# Patient Record
Sex: Male | Born: 1950 | Race: White | Hispanic: No | Marital: Married | Smoking: Former smoker
Health system: Southern US, Community
[De-identification: ages and names within clinical notes are randomized; demographics above are authoritative.]

## PROBLEM LIST (undated history)

## (undated) DIAGNOSIS — E559 Vitamin D deficiency, unspecified: Secondary | ICD-10-CM

## (undated) DIAGNOSIS — E7800 Pure hypercholesterolemia, unspecified: Secondary | ICD-10-CM

## (undated) DIAGNOSIS — I10 Essential (primary) hypertension: Secondary | ICD-10-CM

## (undated) DIAGNOSIS — E785 Hyperlipidemia, unspecified: Secondary | ICD-10-CM

---
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
## 2014-03-08 ENCOUNTER — Other Ambulatory Visit (HOSPITAL_COMMUNITY): Payer: Self-pay | Admitting: Family Medicine

## 2014-03-08 DIAGNOSIS — I10 Essential (primary) hypertension: Secondary | ICD-10-CM

## 2014-03-08 DIAGNOSIS — E782 Mixed hyperlipidemia: Secondary | ICD-10-CM

## 2014-03-08 DIAGNOSIS — R0989 Other specified symptoms and signs involving the circulatory and respiratory systems: Secondary | ICD-10-CM

## 2014-03-10 ENCOUNTER — Other Ambulatory Visit (HOSPITAL_COMMUNITY): Payer: Self-pay | Admitting: Internal Medicine

## 2014-03-10 ENCOUNTER — Ambulatory Visit (HOSPITAL_COMMUNITY)
Admission: RE | Admit: 2014-03-10 | Discharge: 2014-03-10 | Disposition: A | Discharge status: Home / Self Care | Payer: 59 | Source: Ambulatory Visit | Attending: Family Medicine | Admitting: Family Medicine

## 2014-03-10 DIAGNOSIS — I10 Essential (primary) hypertension: Secondary | ICD-10-CM | POA: Diagnosis not present

## 2014-03-10 DIAGNOSIS — R0989 Other specified symptoms and signs involving the circulatory and respiratory systems: Secondary | ICD-10-CM | POA: Diagnosis not present

## 2014-03-10 DIAGNOSIS — E782 Mixed hyperlipidemia: Secondary | ICD-10-CM | POA: Diagnosis not present

## 2015-12-09 ENCOUNTER — Emergency Department (HOSPITAL_COMMUNITY): Payer: Worker's Compensation

## 2015-12-09 ENCOUNTER — Encounter (HOSPITAL_COMMUNITY): Payer: Self-pay | Admitting: Emergency Medicine

## 2015-12-09 ENCOUNTER — Emergency Department (HOSPITAL_COMMUNITY)
Admission: EM | Admit: 2015-12-09 | Discharge: 2015-12-09 | Disposition: A | Discharge status: Home / Self Care | Payer: Worker's Compensation | Attending: Emergency Medicine | Admitting: Emergency Medicine

## 2015-12-09 DIAGNOSIS — Y990 Civilian activity done for income or pay: Secondary | ICD-10-CM | POA: Diagnosis not present

## 2015-12-09 DIAGNOSIS — Y939 Activity, unspecified: Secondary | ICD-10-CM | POA: Diagnosis not present

## 2015-12-09 DIAGNOSIS — I10 Essential (primary) hypertension: Secondary | ICD-10-CM | POA: Insufficient documentation

## 2015-12-09 DIAGNOSIS — Z87891 Personal history of nicotine dependence: Secondary | ICD-10-CM | POA: Insufficient documentation

## 2015-12-09 DIAGNOSIS — Z79899 Other long term (current) drug therapy: Secondary | ICD-10-CM | POA: Diagnosis not present

## 2015-12-09 DIAGNOSIS — Y929 Unspecified place or not applicable: Secondary | ICD-10-CM | POA: Insufficient documentation

## 2015-12-09 DIAGNOSIS — S39012A Strain of muscle, fascia and tendon of lower back, initial encounter: Secondary | ICD-10-CM | POA: Insufficient documentation

## 2015-12-09 DIAGNOSIS — M545 Low back pain: Secondary | ICD-10-CM | POA: Diagnosis present

## 2015-12-09 DIAGNOSIS — X500XXA Overexertion from strenuous movement or load, initial encounter: Secondary | ICD-10-CM | POA: Diagnosis not present

## 2015-12-09 HISTORY — DX: Pure hypercholesterolemia, unspecified: E78.00

## 2015-12-09 HISTORY — DX: Essential (primary) hypertension: I10

## 2015-12-09 MED ORDER — METHOCARBAMOL 500 MG PO TABS: 500.0000 mg | ORAL_TABLET | Freq: Four times a day (QID) | ORAL | Status: AC

## 2015-12-09 MED ORDER — NAPROXEN 500 MG PO TABS: 500.0000 mg | ORAL_TABLET | Freq: Two times a day (BID) | ORAL | Status: DC

## 2015-12-09 MED ORDER — HYDROCODONE-ACETAMINOPHEN 5-325 MG PO TABS: 1.0000 | ORAL_TABLET | ORAL | Status: DC | PRN

## 2015-12-09 MED ORDER — HYDROCODONE-ACETAMINOPHEN 5-325 MG PO TABS
1.0000 | ORAL_TABLET | Freq: Once | ORAL | Status: AC
  Administered 2015-12-09: 1 via ORAL
  Filled 2015-12-09: qty 1

## 2015-12-09 NOTE — Discharge Instructions (Signed)
Back Injury Prevention Back injuries can be very painful. They can also be difficult to heal. After having one back injury, you are more likely to injure your back again. It is important to learn how to avoid injuring or re-injuring your back. The following tips can help you to prevent a back injury. WHAT SHOULD I KNOW ABOUT PHYSICAL FITNESS?  Exercise for 30 minutes per day on most days of the week or as told by your doctor. Make sure to:  Do aerobic exercises, such as walking, jogging, biking, or swimming.  Do exercises that increase balance and strength, such as tai chi and yoga.  Do stretching exercises. This helps with flexibility.  Try to develop strong belly (abdominal) muscles. Your belly muscles help to support your back.  Stay at a healthy weight. This helps to decrease your risk of a back injury. WHAT SHOULD I KNOW ABOUT MY DIET?  Talk with your doctor about your overall diet. Take supplements and vitamins only as told by your doctor.  Talk with your doctor about how much calcium and vitamin D you need each day. These nutrients help to prevent weakening of the bones (osteoporosis).  Include good sources of calcium in your diet, such as:  Dairy products.  Green leafy vegetables.  Products that have had calcium added to them (fortified).  Include good sources of vitamin D in your diet, such as:  Milk.  Foods that have had vitamin D added to them. WHAT SHOULD I KNOW ABOUT MY POSTURE?  Sit up straight and stand up straight. Avoid leaning forward when you sit or hunching over when you stand.  Choose chairs that have good low-back (lumbar) support.  If you work at a desk, sit close to it so you do not need to lean over. Keep your chin tucked in. Keep your neck drawn back. Keep your elbows bent so your arms look like the letter "L" (right angle).  Sit high and close to the steering wheel when you drive. Add a low-back support to your car seat, if needed.  Avoid sitting  or standing in one position for very long. Take breaks to get up, stretch, and walk around at least one time every hour. Take breaks every hour if you are driving for long periods of time.  Sleep on your side with your knees slightly bent, or sleep on your back with a pillow under your knees. Do not lie on the front of your body to sleep. WHAT SHOULD I KNOW ABOUT LIFTING, TWISTING, AND REACHING Lifting and Heavy Lifting  Avoid heavy lifting, especially lifting over and over again. If you must do heavy lifting:  Stretch before lifting.  Work slowly.  Rest between lifts.  Use a tool such as a cart or a dolly to move objects if one is available.  Make several small trips instead of carrying one heavy load.  Ask for help when you need it, especially when moving big objects.  Follow these steps when lifting:  Stand with your feet shoulder-width apart.  Get as close to the object as you can. Do not pick up a heavy object that is far from your body.  Use handles or lifting straps if they are available.  Bend at your knees. Squat down, but keep your heels off the floor.  Keep your shoulders back. Keep your chin tucked in. Keep your back straight.  Lift the object slowly while you tighten the muscles in your legs, belly, and butt. Keep the object  as close to the center of your body as possible.  Follow these steps when putting down a heavy load:  Stand with your feet shoulder-width apart.  Lower the object slowly while you tighten the muscles in your legs, belly, and butt. Keep the object as close to the center of your body as possible.  Keep your shoulders back. Keep your chin tucked in. Keep your back straight.  Bend at your knees. Squat down, but keep your heels off the floor.  Use handles or lifting straps if they are available. Twisting and Reaching  Avoid lifting heavy objects above your waist.  Do not twist at your waist while you are lifting or carrying a load. If  you need to turn, move your feet.  Do not bend over without bending at your knees.  Avoid reaching over your head, across a table, or for an object on a high surface.  WHAT ARE SOME OTHER TIPS?  Avoid wet floors and icy ground. Keep sidewalks clear of ice to prevent falls.   Do not sleep on a mattress that is too soft or too hard.   Keep items that you use often within easy reach.   Put heavier objects on shelves at waist level, and put lighter objects on lower or higher shelves.  Find ways to lower your stress, such as:  Exercise.  Massage.  Relaxation techniques.  Talk with your doctor if you feel anxious or depressed. These conditions can make back pain worse.  Wear flat heel shoes with cushioned soles.  Avoid making quick (sudden) movements.  Use both shoulder straps when carrying a backpack.  Do not use any tobacco products, including cigarettes, chewing tobacco, or electronic cigarettes. If you need help quitting, ask your doctor.   This information is not intended to replace advice given to you by your health care provider. Make sure you discuss any questions you have with your health care provider.   Document Released: 10/29/2007 Document Revised: 09/26/2014 Document Reviewed: 05/16/2014 Elsevier Interactive Patient Education 2016 [employer]  Lumbosacral Strain Lumbosacral strain is a strain of any of the parts that make up your lumbosacral vertebrae. Your lumbosacral vertebrae are the bones that make up the lower third of your backbone. Your lumbosacral vertebrae are held together by muscles and tough, fibrous tissue (ligaments).  CAUSES  A sudden blow to your back can cause lumbosacral strain. Also, anything that causes an excessive stretch of the muscles in the low back can cause this strain. This is typically seen when people exert themselves strenuously, fall, lift heavy objects, bend, or crouch repeatedly. RISK FACTORS  Physically demanding  work.  Participation in pushing or pulling sports or sports that require a sudden twist of the back (tennis, golf, baseball).  Weight lifting.  Excessive lower back curvature.  Forward-tilted pelvis.  Weak back or abdominal muscles or both.  Tight hamstrings. SIGNS AND SYMPTOMS  Lumbosacral strain may cause pain in the area of your injury or pain that moves (radiates) down your leg.  DIAGNOSIS Your health care provider can often diagnose lumbosacral strain through a physical exam. In some cases, you may need tests such as X-ray exams.  TREATMENT  Treatment for your lower back injury depends on many factors that your clinician will have to evaluate. However, most treatment will include the use of anti-inflammatory medicines. HOME CARE INSTRUCTIONS   Avoid hard physical activities (tennis, racquetball, waterskiing) if you are not in proper physical condition for it. This may aggravate or create  problems.  If you have a back problem, avoid sports requiring sudden body movements. Swimming and walking are generally safer activities.  Maintain good posture.  Maintain a healthy weight.  For acute conditions, you may put ice on the injured area.  Put ice in a plastic bag.  Place a towel between your skin and the bag.  Leave the ice on for 20 minutes, 2-3 times a day.  When the low back starts healing, stretching and strengthening exercises may be recommended. SEEK MEDICAL CARE IF:  Your back pain is getting worse.  You experience severe back pain not relieved with medicines. SEEK IMMEDIATE MEDICAL CARE IF:   You have numbness, tingling, weakness, or problems with the use of your arms or legs.  There is a change in bowel or bladder control.  You have increasing pain in any area of the body, including your belly (abdomen).  You notice shortness of breath, dizziness, or feel faint.  You feel sick to your stomach (nauseous), are throwing up (vomiting), or become  sweaty.  You notice discoloration of your toes or legs, or your feet get very cold. MAKE SURE YOU:   Understand these instructions.  Will watch your condition.  Will get help right away if you are not doing well or get worse.   This information is not intended to replace advice given to you by your health care provider. Make sure you discuss any questions you have with your health care provider.   Document Released: 02/19/2005 Document Revised: 06/02/2014 Document Reviewed: 12/29/2012 Elsevier Interactive Patient Education 2016 [place] not drive within 4 hours of taking hydrocodone or robaxin as these will make you drowsy.  Avoid lifting,  Bending,  Twisting or any other activity that worsens your pain over the next week.  Apply an  icepack  to your lower back for 10-15 minutes every 2 hours for the next 2 days.  You should get rechecked if your symptoms are not better over the next 5 days,  Or you develop increased pain,  Weakness in your leg(s) or loss of bladder or bowel function - these are symptoms of a worse injury.

## 2015-12-09 NOTE — ED Provider Notes (Signed)
CSN: [id number]     Arrival date & time 12/09/15  1209 History  By signing my name below, I, Doreatha Martin, attest that this documentation has been prepared under the direction and in the presence of Burgess Amor, PA-C. Electronically Signed: Doreatha Martin, ED Scribe. 12/09/2015. 1:00 PM.    Chief Complaint  Patient presents with  . Back Pain   The history is provided by the patient. No language interpreter was used.   HPI Comments: Patrick Nielsen is a 65 y.o. male who presents to the Emergency Department complaining of moderate, non-radiating, throbbing left lower back pain onset 2 weeks ago s/p lifting a 100 lb object. He denies additional injuries, falls or trauma. Per pt, he felt a small amount of pain initially that has continued to worsen since initial onset. He reports that he frequently lifts this amount of weight. He reports that he has tried ice, topical cream and heat with some relief of pain. Pt states that his pain is alleviated when lying flat and worsened with moment, ambulation and in certain positions. He reports h/o similar back pain. Pt is ambulatory with minimal difficulty. No h/o cancer, IVDU, back surgery. Pt is a former smoker and an occasional drinker. He denies bowel or bladder incontinence, saddle anesthesia, fever, cough, abdominal pain, nausea, emesis, dysuria, hematuria, frequency, rash. He also denies numbness, focal weakness or paresthesia of the lower extremities. NKDA.   Past Medical History  Diagnosis Date  . Hypertension   . High cholesterol    History reviewed. No pertinent past surgical history. History reviewed. No pertinent family history. Social History  Substance Use Topics  . Smoking status: Former Smoker -- 2.00 packs/day for 28 years    Types: Cigarettes    Quit date: 05/26/1996  . Smokeless tobacco: Never Used  . Alcohol Use: 7.2 oz/week    12 Cans of beer per week    Review of Systems  Constitutional: Negative for fever.  Respiratory: Negative for  cough.   Gastrointestinal: Negative for nausea, vomiting and abdominal pain.       Negative for bowel incontinence.   Genitourinary: Negative for dysuria, frequency and hematuria.       Negative for bladder incontinence and saddle anesthesia.   Musculoskeletal: Positive for back pain.  Skin: Negative for rash.  Neurological: Negative for weakness and numbness.       Negative for paresthesias.    Allergies  Review of patient's allergies indicates no known allergies.  Home Medications   Prior to Admission medications   Medication Sig Start Date End Date Taking? Authorizing Provider  amLODipine (NORVASC) 10 MG tablet Take 10 mg by mouth daily. 11/14/15  Yes Historical Provider, MD  losartan-hydrochlorothiazide (HYZAAR) 100-25 MG tablet Take 1 tablet by mouth daily. 11/19/15  Yes Historical Provider, MD  Omega-3 Fatty Acids (FISH OIL PO) Take 1 capsule by mouth daily.   Yes Historical Provider, MD  simvastatin (ZOCOR) 20 MG tablet Take 20 mg by mouth daily. 11/19/15  Yes Historical Provider, MD  HYDROcodone-acetaminophen (NORCO/VICODIN) 5-325 MG tablet Take 1 tablet by mouth every 4 (four) hours as needed. 12/09/15   Burgess Amor, PA-C  methocarbamol (ROBAXIN) 500 MG tablet Take 1-2 tablets (500-1,000 mg total) by mouth 4 (four) times daily. 12/09/15 12/19/15  Burgess Amor, PA-C  naproxen (NAPROSYN) 500 MG tablet Take 1 tablet (500 mg total) by mouth 2 (two) times daily. 12/09/15   Burgess Amor, PA-C   BP 133/56 mmHg  Pulse 76  Temp(Src) 98 F (36.7  C) (Oral)  Resp 18  Ht 5\' 6"  (1.676 m)  Wt 93.895 kg  BMI 33.43 kg/m2  SpO2 98% Physical Exam  Constitutional: He appears well-developed and well-nourished.  HENT:  Head: Normocephalic.  Eyes: Conjunctivae are normal.  Neck: Normal range of motion. Neck supple.  Cardiovascular: Normal rate and intact distal pulses.   Pedal pulses normal.  Pulmonary/Chest: Effort normal.  Abdominal: Soft. Bowel sounds are normal. He exhibits no distension and no  mass.  Musculoskeletal: Normal range of motion. He exhibits no edema.       Lumbar back: He exhibits tenderness. He exhibits no swelling, no edema and no spasm.  ttp left lumbar soft tissue.  Neurological: He is alert. He has normal strength. He displays no atrophy and no tremor. No sensory deficit. Gait normal.  Reflex Scores:      Patellar reflexes are 2+ on the right side and 2+ on the left side.      Achilles reflexes are 2+ on the right side and 2+ on the left side. No strength deficit noted in hip and knee flexor and extensor muscle groups.  Ankle flexion and extension intact.  Skin: Skin is warm and dry.  Psychiatric: He has a normal mood and affect.  Nursing note and vitals reviewed.   ED Course  Procedures (including critical care time) DIAGNOSTIC STUDIES: Oxygen Saturation is 100% on RA, normal by my interpretation.    COORDINATION OF CARE: 12:57 PM Discussed treatment plan with pt at bedside which includes XR and pt agreed to plan.   Imaging Review Dg Lumbar Spine Complete  12/09/2015  CLINICAL DATA:  Low back pain EXAM: LUMBAR SPINE - COMPLETE 4+ VIEW COMPARISON:  None. FINDINGS: There is no evidence of lumbar spine fracture. Alignment is normal. Mild degenerative disc disease noted within the lower lumbar spine. Aortic atherosclerosis is noted. IMPRESSION: 1. Lumbar degenerative disc disease. 2. Aortic atherosclerosis. Electronically Signed   By: Signa Kellaylor  Stroud M.D.   On: 12/09/2015 13:54   I have personally reviewed and evaluated these images as part of my medical decision-making.   MDM   Final diagnoses:  Lumbar strain, initial encounter    No neuro deficit on exam or by history to suggest emergent or surgical presentation.   discussed worsened sx that should prompt immediate re-evaluation including distal weakness, bowel/bladder retention/incontinence.   Patient was placed on hydrocodone, Robaxin and naproxen.  Advised heat therapy.  Activity as tolerated.  Plan  follow-up with his PCP for recheck is symptoms are not improving over the next week.       Burgess AmorJulie Wang [place], PA-C 12/09/15 1651  Samuel JesterKathleen McManus, DO 12/10/15 2229

## 2015-12-09 NOTE — ED Notes (Signed)
Patient c/o left lower back pain that radiates into mid-back. 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 Denies taking anything for pain but reports using ice and heat with no relief. CNS intact. Denies any problems with urination or BMs.

## 2016-02-17 ENCOUNTER — Emergency Department (HOSPITAL_COMMUNITY)
Admission: EM | Admit: 2016-02-17 | Discharge: 2016-02-17 | Disposition: A | Discharge status: Home / Self Care | Payer: 59 | Attending: Emergency Medicine | Admitting: Emergency Medicine

## 2016-02-17 ENCOUNTER — Emergency Department (HOSPITAL_COMMUNITY): Payer: 59

## 2016-02-17 ENCOUNTER — Encounter (HOSPITAL_COMMUNITY): Payer: Self-pay | Admitting: Emergency Medicine

## 2016-02-17 DIAGNOSIS — W11XXXA Fall on and from ladder, initial encounter: Secondary | ICD-10-CM | POA: Insufficient documentation

## 2016-02-17 DIAGNOSIS — I10 Essential (primary) hypertension: Secondary | ICD-10-CM | POA: Insufficient documentation

## 2016-02-17 DIAGNOSIS — Z79899 Other long term (current) drug therapy: Secondary | ICD-10-CM | POA: Insufficient documentation

## 2016-02-17 DIAGNOSIS — S93402A Sprain of unspecified ligament of left ankle, initial encounter: Secondary | ICD-10-CM | POA: Insufficient documentation

## 2016-02-17 DIAGNOSIS — Y999 Unspecified external cause status: Secondary | ICD-10-CM | POA: Diagnosis not present

## 2016-02-17 DIAGNOSIS — Z87891 Personal history of nicotine dependence: Secondary | ICD-10-CM | POA: Insufficient documentation

## 2016-02-17 DIAGNOSIS — S99912A Unspecified injury of left ankle, initial encounter: Secondary | ICD-10-CM | POA: Diagnosis present

## 2016-02-17 DIAGNOSIS — W19XXXA Unspecified fall, initial encounter: Secondary | ICD-10-CM

## 2016-02-17 DIAGNOSIS — Y929 Unspecified place or not applicable: Secondary | ICD-10-CM | POA: Insufficient documentation

## 2016-02-17 DIAGNOSIS — Y9339 Activity, other involving climbing, rappelling and jumping off: Secondary | ICD-10-CM | POA: Diagnosis not present

## 2016-02-17 MED ORDER — IBUPROFEN 800 MG PO TABS
800.0000 mg | ORAL_TABLET | Freq: Once | ORAL | Status: AC
  Administered 2016-02-17: 800 mg via ORAL
  Filled 2016-02-17: qty 1

## 2016-02-17 MED ORDER — ACETAMINOPHEN 500 MG PO TABS
1000.0000 mg | ORAL_TABLET | Freq: Once | ORAL | Status: AC
  Administered 2016-02-17: 1000 mg via ORAL
  Filled 2016-02-17: qty 2

## 2016-02-17 NOTE — Discharge Instructions (Signed)
X-ray shows no fracture. Elevate ankle, ice, ankle support, Tylenol or ibuprofen for pain. This will be sore for several days.

## 2016-02-17 NOTE — ED Triage Notes (Addendum)
Pt reports was reaching for post hole diggers while standing on a step ladder on Friday. Pt reports lost balance and reports left ankle pain ever since. Pt reports fell approximately 533ft. Pt denies loc. nad noted. EDP at bedside. Pt reports took percocet prior to arrival. Pt reports prescription from past back injury.

## 2016-02-17 NOTE — ED Provider Notes (Signed)
[facility]-EMERGENCY DEPT Provider Note   CSN: [id number] Arrival date & time: 02/17/16  0756   By signing my name below, I, Nelwyn Salisbury, attest that this documentation has been prepared under the direction and in the presence of Donnetta Hutching, MD . Electronically Signed: Nelwyn Salisbury, Scribe. 02/17/2016. 8:03 AM.   History   Chief Complaint Chief Complaint  Patient presents with  . Fall   The history is provided by the patient. No language interpreter was used.   HPI Comments:  Dezmin Kittelson is a 65 y.o. male with PMHx of HTN and HLD who presents to the Emergency Department complaining of sudden-onset constant left ankle pain beginning 2 days ago. Pt reports he was climbing a ladder, lost his footing, and fell about 4 feet. His pain is worsened with movement and palpation, with no alleviating factors indicated. He endorses associated swelling to the area.   Past Medical History:  Diagnosis Date  . High cholesterol   . Hypertension     There are no active problems to display for this patient.   History reviewed. No pertinent surgical history.   Home Medications    Prior to Admission medications   Medication Sig Start Date End Date Taking? Authorizing Provider  amLODipine (NORVASC) 10 MG tablet Take 10 mg by mouth daily. 11/14/15   Historical Provider, MD  HYDROcodone-acetaminophen (NORCO/VICODIN) 5-325 MG tablet Take 1 tablet by mouth every 4 (four) hours as needed. 12/09/15   Burgess Amor, PA-C  losartan-hydrochlorothiazide (HYZAAR) 100-25 MG tablet Take 1 tablet by mouth daily. 11/19/15   Historical Provider, MD  naproxen (NAPROSYN) 500 MG tablet Take 1 tablet (500 mg total) by mouth 2 (two) times daily. 12/09/15   Burgess Amor, PA-C  Omega-3 Fatty Acids (FISH OIL PO) Take 1 capsule by mouth daily.    Historical Provider, MD  simvastatin (ZOCOR) 20 MG tablet Take 20 mg by mouth daily. 11/19/15   Historical Provider, MD    Family History History reviewed. No pertinent family  history.  Social History Social History  Substance Use Topics  . Smoking status: Former Smoker    Packs/day: 2.00    Years: 28.00    Types: Cigarettes    Quit date: 05/26/1996  . Smokeless tobacco: Never Used  . Alcohol use 7.2 oz/week    12 Cans of beer per week     Comment: only on weekends     Allergies   Review of patient's allergies indicates no known allergies.   Review of Systems Review of Systems  Musculoskeletal: Positive for arthralgias and joint swelling.  All other systems reviewed and are negative.    Physical Exam Updated Vital Signs BP 146/56   Pulse 78   Temp 98.4 F (36.9 C) (Oral)   Resp (!) 81   Ht 5\' 6"  (1.676 m)   Wt 200 lb (90.7 kg)   SpO2 99%   BMI 32.28 kg/m   Physical Exam  Constitutional: He is oriented to person, place, and time. He appears well-developed and well-nourished.  HENT:  Head: Normocephalic and atraumatic.  Eyes: Conjunctivae are normal.  Neck: Neck supple.  Cardiovascular: Normal rate and regular rhythm.   Pulmonary/Chest: Effort normal and breath sounds normal.  Abdominal: Soft. Bowel sounds are normal.  Musculoskeletal:  Generally tender circumferentially to left ankle. Tib/Fib fine, foot okay.   Neurological: He is alert and oriented to person, place, and time.  Skin: Skin is warm and dry.  Psychiatric: He has a normal mood and affect. His behavior  is normal.  Nursing note and vitals reviewed.    ED Treatments / Results  DIAGNOSTIC STUDIES:  Oxygen Saturation is 100% on RA, normal by my interpretation.    Labs (all labs ordered are listed, but only abnormal results are displayed) Labs Reviewed - No data to display  EKG  EKG Interpretation None       Radiology Dg Ankle Complete Left  Result Date: 02/17/2016 CLINICAL DATA:  Left ankle pain since following a approximately 3 feet off a ladder 2 days ago. EXAM: LEFT ANKLE COMPLETE - 3+ VIEW COMPARISON:  None. FINDINGS: Diffuse soft tissue swelling, most  pronounced laterally. Possible small effusion. No fracture or dislocation seen. Moderate-sized inferior calcaneal spur. IMPRESSION: No fracture.  Possible small effusion. Electronically Signed   By: Beckie SaltsSteven  Reid M.D.   On: 02/17/2016 08:35    Procedures Procedures (including critical care time)  Medications Ordered in ED Medications - No data to display   Initial Impression / Assessment and Plan / ED Course  I have reviewed the triage vital signs and the nursing notes.  Pertinent labs & imaging results that were available during my care of the patient were reviewed by me and considered in my medical decision making (see chart for details).  Clinical Course   COORDINATION OF CARE:  8:05 AM Discussed treatment plan with pt at bedside which included an X-ray of his left ankle and pt agreed to plan.  Plain films of left ankle negative for fracture. No head or neck trauma. RICE.  Final Clinical Impressions(s) / ED Diagnoses   Final diagnoses:  Fall, initial encounter  Left ankle sprain, initial encounter    New Prescriptions New Prescriptions   No medications on file   Medical screening examination/treatment/procedure(s) were conducted as a shared visit with non-physician practitioner(s) and myself.  I personally evaluated the patient during the encounter.   EKG Interpretation None        Donnetta HutchingBrian Jaislyn Blinn, MD 02/17/16 1017

## 2016-03-06 ENCOUNTER — Other Ambulatory Visit (HOSPITAL_COMMUNITY): Payer: Self-pay | Admitting: Family Medicine

## 2016-03-06 DIAGNOSIS — I6523 Occlusion and stenosis of bilateral carotid arteries: Secondary | ICD-10-CM

## 2016-03-06 DIAGNOSIS — E782 Mixed hyperlipidemia: Secondary | ICD-10-CM

## 2016-11-04 ENCOUNTER — Other Ambulatory Visit (HOSPITAL_COMMUNITY): Payer: Self-pay | Admitting: Internal Medicine

## 2016-11-04 DIAGNOSIS — R0989 Other specified symptoms and signs involving the circulatory and respiratory systems: Secondary | ICD-10-CM

## 2016-11-05 ENCOUNTER — Other Ambulatory Visit (HOSPITAL_COMMUNITY): Payer: Self-pay | Admitting: Internal Medicine

## 2016-11-05 DIAGNOSIS — R011 Cardiac murmur, unspecified: Secondary | ICD-10-CM

## 2016-11-07 ENCOUNTER — Other Ambulatory Visit (HOSPITAL_COMMUNITY): Payer: Self-pay

## 2016-11-07 ENCOUNTER — Ambulatory Visit (HOSPITAL_COMMUNITY): Payer: 59

## 2016-11-11 ENCOUNTER — Ambulatory Visit (HOSPITAL_COMMUNITY)
Admission: RE | Admit: 2016-11-11 | Discharge: 2016-11-11 | Disposition: A | Discharge status: Home / Self Care | Payer: 59 | Source: Ambulatory Visit | Attending: Internal Medicine | Admitting: Internal Medicine

## 2016-11-11 DIAGNOSIS — R0989 Other specified symptoms and signs involving the circulatory and respiratory systems: Secondary | ICD-10-CM | POA: Diagnosis present

## 2016-11-11 DIAGNOSIS — I371 Nonrheumatic pulmonary valve insufficiency: Secondary | ICD-10-CM | POA: Diagnosis not present

## 2016-11-11 DIAGNOSIS — I6523 Occlusion and stenosis of bilateral carotid arteries: Secondary | ICD-10-CM | POA: Diagnosis not present

## 2016-11-11 DIAGNOSIS — R011 Cardiac murmur, unspecified: Secondary | ICD-10-CM

## 2016-11-11 DIAGNOSIS — I081 Rheumatic disorders of both mitral and tricuspid valves: Secondary | ICD-10-CM | POA: Insufficient documentation

## 2016-11-11 NOTE — Progress Notes (Signed)
*  PRELIMINARY RESULTS* Echocardiogram 2D Echocardiogram has been performed.  Stacey DrainWhite, Saylee Sherrill J 11/11/2016, 9:08 AM

## 2017-02-26 ENCOUNTER — Emergency Department (HOSPITAL_COMMUNITY)
Admission: EM | Admit: 2017-02-26 | Discharge: 2017-02-26 | Disposition: A | Discharge status: Home / Self Care | Payer: Worker's Compensation | Attending: Emergency Medicine | Admitting: Emergency Medicine

## 2017-02-26 ENCOUNTER — Encounter (HOSPITAL_COMMUNITY): Payer: Self-pay | Admitting: Emergency Medicine

## 2017-02-26 ENCOUNTER — Emergency Department (HOSPITAL_COMMUNITY): Payer: Worker's Compensation

## 2017-02-26 DIAGNOSIS — Y33XXXA Other specified events, undetermined intent, initial encounter: Secondary | ICD-10-CM | POA: Insufficient documentation

## 2017-02-26 DIAGNOSIS — Y990 Civilian activity done for income or pay: Secondary | ICD-10-CM | POA: Insufficient documentation

## 2017-02-26 DIAGNOSIS — S39012A Strain of muscle, fascia and tendon of lower back, initial encounter: Secondary | ICD-10-CM | POA: Diagnosis not present

## 2017-02-26 DIAGNOSIS — Y9389 Activity, other specified: Secondary | ICD-10-CM | POA: Insufficient documentation

## 2017-02-26 DIAGNOSIS — Z87891 Personal history of nicotine dependence: Secondary | ICD-10-CM | POA: Diagnosis not present

## 2017-02-26 DIAGNOSIS — E7800 Pure hypercholesterolemia, unspecified: Secondary | ICD-10-CM | POA: Insufficient documentation

## 2017-02-26 DIAGNOSIS — Y9289 Other specified places as the place of occurrence of the external cause: Secondary | ICD-10-CM | POA: Insufficient documentation

## 2017-02-26 DIAGNOSIS — I10 Essential (primary) hypertension: Secondary | ICD-10-CM | POA: Diagnosis not present

## 2017-02-26 DIAGNOSIS — Z7982 Long term (current) use of aspirin: Secondary | ICD-10-CM | POA: Insufficient documentation

## 2017-02-26 DIAGNOSIS — M545 Low back pain: Secondary | ICD-10-CM | POA: Diagnosis present

## 2017-02-26 DIAGNOSIS — Z79899 Other long term (current) drug therapy: Secondary | ICD-10-CM | POA: Diagnosis not present

## 2017-02-26 MED ORDER — NAPROXEN 500 MG PO TABS: 500.0000 mg | ORAL_TABLET | Freq: Two times a day (BID) | ORAL | 0 refills | Status: DC

## 2017-02-26 MED ORDER — METHOCARBAMOL 500 MG PO TABS: 500.0000 mg | ORAL_TABLET | Freq: Four times a day (QID) | ORAL | 0 refills | Status: AC

## 2017-02-26 NOTE — ED Notes (Signed)
Patient given discharge instruction, verbalized understand. IV removed, band aid applied. Patient ambulatory out of the department.  

## 2017-02-26 NOTE — Discharge Instructions (Signed)
Avoid lifting,  Bending,  Twisting or any other activity that worsens your pain over the next week.  Apply a heating pad to your lower back for 20 minutes 3-4 times daily.  You should get rechecked if your symptoms are not better over the next 5 days,  Or you develop increased pain,  Weakness in your leg(s) or loss of bladder or bowel function - these are symptoms of a worsening injury.  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 You may need further imaging if your pain does not resolve with todays treatment.

## 2017-02-26 NOTE — ED Triage Notes (Signed)
Last Thursday, near miss for fall, carrying a ladder, low back pain. Pt walks a lot for his job, notices some hip and leg pain today

## 2017-02-27 NOTE — ED Provider Notes (Signed)
[facility] DEPT Provider Note   CSN: [id number] Arrival date & time: 02/26/17  7829     History   Chief Complaint Chief Complaint  Patient presents with  . Back Pain    HPI Patrick Nielsen is a 66 y.o. male who has an occasional transient episode of low back pain presenting with a one week history of left lower back pain which occurred with a work related injury.  He describes lifting one end of a long, heavy ladder that he and a coworker was lifting onto hooks when his end started falling.  He grabbed it and twisted his lower back with pain ever since the event.  It is constant, aching and without radiation. There is no radiation of pain into his legs.  There has been no weakness or numbness in the lower extremities and no urinary or bowel retention or incontinence.  Patient does not have a history of cancer or IVDU.  The patient has tried tylenol, ice and heat with mild relief of symptoms.   The history is provided by the patient.    Past Medical History:  Diagnosis Date  . High cholesterol   . Hypertension     There are no active problems to display for this patient.   History reviewed. No pertinent surgical history.     Home Medications    Prior to Admission medications   Medication Sig Start Date End Date Taking? Authorizing Provider  amLODipine (NORVASC) 10 MG tablet Take 10 mg by mouth daily. 11/14/15  Yes [provider]  aspirin EC 81 MG tablet Take 81 mg by mouth daily.   Yes [provider]  losartan-hydrochlorothiazide (HYZAAR) 100-25 MG tablet Take 1 tablet by mouth daily. 11/19/15  Yes [provider]  Omega-3 Fatty Acids (FISH OIL PO) Take 1 capsule by mouth daily.   Yes [provider]  simvastatin (ZOCOR) 20 MG tablet Take 20 mg by mouth daily. 11/19/15  Yes [provider]  methocarbamol (ROBAXIN) 500 MG tablet Take 1 tablet (500 mg total) by mouth 4 (four) times daily. 02/26/17 03/08/17  Burgess Amor, PA-C    naproxen (NAPROSYN) 500 MG tablet Take 1 tablet (500 mg total) by mouth 2 (two) times daily. 02/26/17   Burgess Amor, PA-C    Family History No family history on file.  Social History Social History  Substance Use Topics  . Smoking status: Former Smoker    Packs/day: 2.00    Years: 28.00    Types: Cigarettes    Quit date: 05/26/1996  . Smokeless tobacco: Never Used  . Alcohol use 7.2 oz/week    12 Cans of beer per week     Comment: only on weekends     Allergies   Patient has no known allergies.   Review of Systems Review of Systems  Constitutional: Negative for fever.  Respiratory: Negative for shortness of breath.   Cardiovascular: Negative for chest pain and leg swelling.  Gastrointestinal: Negative for abdominal distention, abdominal pain and constipation.  Genitourinary: Negative for difficulty urinating, dysuria, flank pain, frequency and urgency.  Musculoskeletal: Positive for back pain. Negative for gait problem and joint swelling.  Skin: Negative for rash.  Neurological: Negative for weakness and numbness.     Physical Exam Updated Vital Signs BP (!) 155/70 (BP Location: Left Arm)   Pulse 64   Temp 98.3 F (36.8 C) (Oral)   Resp 20   Ht  (1.676 m)   Wt 81.6 kg (180 lb)  SpO2 98%   BMI 29.05 kg/m   Physical Exam  Constitutional: He appears well-developed and well-nourished.  HENT:  Head: Normocephalic.  Eyes: Conjunctivae are normal.  Neck: Normal range of motion. Neck supple.  Cardiovascular: Normal rate and intact distal pulses.   Pedal pulses normal.  Pulmonary/Chest: Effort normal.  Abdominal: Soft. Bowel sounds are normal. He exhibits no distension and no mass.  Musculoskeletal: Normal range of motion. He exhibits no edema.       Lumbar back: He exhibits tenderness. He exhibits no bony tenderness, no swelling, no edema, no deformity and no spasm.       Back:  Neurological: He is alert. He has normal strength. He displays no atrophy and  no tremor. No sensory deficit. Gait normal.  Reflex Scores:      Patellar reflexes are 2+ on the right side and 2+ on the left side.      Achilles reflexes are 2+ on the right side and 2+ on the left side. No strength deficit noted in hip and knee flexor and extensor muscle groups.  Ankle flexion and extension intact.  Skin: Skin is warm and dry.  Psychiatric: He has a normal mood and affect.  Nursing note and vitals reviewed.    ED Treatments / Results  Labs (all labs ordered are listed, but only abnormal results are displayed) Labs Reviewed - No data to display  EKG  EKG Interpretation None       Radiology Dg Lumbar Spine Complete  Result Date: 02/26/2017 CLINICAL DATA:  None posterior back pain after slipping on a ladder last week. Worsening pain. EXAM: LUMBAR SPINE - COMPLETE 4+ VIEW COMPARISON:  Lumbar spine series of December 09, 2015 FINDINGS: The lumbar vertebral bodies are preserved in height. The pedicles and transverse processes are intact. There is moderate disc space narrowing at L4-5. There is mild disc space narrowing at L3-4. There is no spondylolisthesis. There is an anterior near bridging osteophyte at L4-5. There is facet joint hypertrophy at L4-5. The observed portions of the sacrum are normal. There is calcification in the wall of the abdominal aorta. 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 There is facet joint hypertrophy at L4-5 as well. There is no acute bony abnormality. Abdominal aortic atherosclerosis. Electronically Signed   By: David  [place] M.D.   On: 02/26/2017 12:57    Procedures Procedures (including critical care time)  Medications Ordered in ED Medications - No data to display   Initial Impression / Assessment and Plan / ED Course  I have reviewed the triage vital signs and the nursing notes.  Pertinent labs & imaging results that were available during my care of the patient were  reviewed by me and considered in my medical decision making (see chart for details).    Imaging results discussed with pt. No neuro deficit on exam or by history to suggest emergent or surgical presentation.  Also discussed worsened sx that should prompt immediate re-evaluation including distal weakness, bowel/bladder retention/incontinence. Naproxen, robaxin, heat, activities as tolerated. F/u with pcp if not improving over the next week.         Final Clinical Impressions(s) / ED Diagnoses   Final diagnoses:  Strain of lumbar region, initial encounter    New Prescriptions Discharge Medication List as of 02/26/2017  1:15 PM    START taking these medications   Details  methocarbamol (ROBAXIN) 500 MG tablet Take 1 tablet (500 mg total) by mouth  4 (four) times daily., Starting Thu 02/26/2017, Until Sun 03/08/2017, Print    naproxen (NAPROSYN) 500 MG tablet Take 1 tablet (500 mg total) by mouth 2 (two) times daily., Starting Thu 02/26/2017, Print         Burgess Amor, PA-C 02/27/17 4098    Mancel Bale, MD 03/01/17 [phone]

## 2017-04-18 ENCOUNTER — Encounter (HOSPITAL_COMMUNITY): Payer: Self-pay | Admitting: Emergency Medicine

## 2017-04-18 ENCOUNTER — Emergency Department (HOSPITAL_COMMUNITY): Payer: Worker's Compensation

## 2017-04-18 ENCOUNTER — Emergency Department (HOSPITAL_COMMUNITY)
Admission: EM | Admit: 2017-04-18 | Discharge: 2017-04-18 | Disposition: A | Discharge status: Home / Self Care | Payer: Worker's Compensation | Attending: Emergency Medicine | Admitting: Emergency Medicine

## 2017-04-18 ENCOUNTER — Other Ambulatory Visit: Payer: Self-pay

## 2017-04-18 DIAGNOSIS — Y9389 Activity, other specified: Secondary | ICD-10-CM | POA: Diagnosis not present

## 2017-04-18 DIAGNOSIS — Z79899 Other long term (current) drug therapy: Secondary | ICD-10-CM | POA: Insufficient documentation

## 2017-04-18 DIAGNOSIS — Z87891 Personal history of nicotine dependence: Secondary | ICD-10-CM | POA: Diagnosis not present

## 2017-04-18 DIAGNOSIS — Y990 Civilian activity done for income or pay: Secondary | ICD-10-CM | POA: Insufficient documentation

## 2017-04-18 DIAGNOSIS — I10 Essential (primary) hypertension: Secondary | ICD-10-CM | POA: Insufficient documentation

## 2017-04-18 DIAGNOSIS — S8992XA Unspecified injury of left lower leg, initial encounter: Secondary | ICD-10-CM | POA: Diagnosis present

## 2017-04-18 DIAGNOSIS — Z7982 Long term (current) use of aspirin: Secondary | ICD-10-CM | POA: Insufficient documentation

## 2017-04-18 DIAGNOSIS — S86912A Strain of unspecified muscle(s) and tendon(s) at lower leg level, left leg, initial encounter: Secondary | ICD-10-CM | POA: Insufficient documentation

## 2017-04-18 DIAGNOSIS — Y929 Unspecified place or not applicable: Secondary | ICD-10-CM | POA: Insufficient documentation

## 2017-04-18 DIAGNOSIS — X509XXA Other and unspecified overexertion or strenuous movements or postures, initial encounter: Secondary | ICD-10-CM | POA: Insufficient documentation

## 2017-04-18 MED ORDER — IBUPROFEN 600 MG PO TABS: 600.0000 mg | ORAL_TABLET | Freq: Three times a day (TID) | ORAL | 0 refills | Status: DC

## 2017-04-18 NOTE — ED Triage Notes (Signed)
Pt hurt his Lt knee yesterday after crawling around on the floor at work.  States when he stood up it was very painful.

## 2017-04-18 NOTE — ED Provider Notes (Signed)
[facility]ERGENCY [facility]PART[facility] Provider Note   CSN: [id number] Arrival date & time: 04/18/17  1219     History   Chief Complaint Chief Complaint  Patient presents with  . Knee Injury    HPI Patrick Nielsen is a 66 y.o. male presenting with left knee pain which started yesterday. 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  He noticed yesterday after being on his knees for a while, having severe pain when he attempted to stand up. His pain is worse with movement, improves after he has walked a few steps, but continues to be painful. He denies radiation of pain, weakness or numbness.  He has had no treatment prior to arrival.        The history is provided by the patient.    Past Medical History:  Diagnosis Date  . High cholesterol   . Hypertension     There are no active problems to display for this patient.   History reviewed. No pertinent surgical history.     Home Medications    Prior to Admission medications   Medication Sig Start Date End Date Taking? Authorizing Provider  amLODipine (NORVASC) 10 MG tablet Take 10 mg by mouth daily. 11/14/15   [provider]  aspirin EC 81 MG tablet Take 81 mg by mouth daily.    [provider]  ibuprofen (ADVIL,MOTRIN) 600 MG tablet Take 1 tablet (600 mg total) by mouth 3 (three) times daily. 04/18/17   Burgess AmorIdol, Whitten Andreoni, PA-C  losartan-hydrochlorothiazide (HYZAAR) 100-25 MG tablet Take 1 tablet by mouth daily. 11/19/15   [provider]  naproxen (NAPROSYN) 500 MG tablet Take 1 tablet (500 mg total) by mouth 2 (two) times daily. 02/26/17   Burgess AmorIdol, Gryphon Vanderveen, PA-C  Omega-3 Fatty Acids (FISH OIL PO) Take 1 capsule by mouth daily.    [provider]  simvastatin (ZOCOR) 20 MG tablet Take 20 mg by mouth daily. 11/19/15   [provider]    Family History No family  history on file.  Social History Social History   Tobacco Use  . Smoking status: Former Smoker    Packs/day: 2.00    Years: 28.00    Pack years: 56.00    Types: Cigarettes    Last attempt to quit: 05/26/1996    Years since quitting: 20.9  . Smokeless tobacco: Never Used  Substance Use Topics  . Alcohol use: Yes    Alcohol/week: 7.2 oz    Types: 12 Cans of beer per week    Comment: only on weekends  . Drug use: No     Allergies   Patient has no known allergies.   Review of Systems Review of Systems  Constitutional: Negative for fever.  Musculoskeletal: Positive for arthralgias. Negative for joint swelling and myalgias.  Neurological: Negative for weakness and numbness.     Physical Exam Updated Vital Signs BP (!) 151/63 (BP Location: Right Arm)   Pulse 68   Temp 97.6 F (36.4 C) (Oral)   Resp 19   Ht 5\' 6"  (1.676 m)   Wt 80.7 kg (178 lb)   SpO2 100%   BMI 28.73 kg/m    Physical Exam  Constitutional: He appears well-developed and well-nourished.  HENT:  Head: Atraumatic.  Neck: Normal range of motion.  Cardiovascular:  Pulses equal bilaterally  Musculoskeletal: He exhibits tenderness.  Left knee: He exhibits abnormal meniscus. He exhibits normal range of motion, no swelling, no effusion, no deformity, normal alignment, no LCL laxity and no MCL laxity.  Negative drawer test, no crepitus with ROM.  ttp medial anterior joint line without deformity.  Neurological: He is alert. He has normal strength. He displays normal reflexes. No sensory deficit.  Skin: Skin is warm and dry.  Psychiatric: He has a normal mood and affect.     ED Treatments / Results  Labs (all labs ordered are listed, but only abnormal results are displayed) Labs Reviewed - No data to display  EKG  EKG Interpretation None       Radiology Dg Knee Complete 4 Views Left  Result Date: 04/18/2017 CLINICAL DATA:  Left knee pain after injury at work yesterday. EXAM: LEFT KNEE -  COMPLETE 4+ VIEW COMPARISON:  None. FINDINGS: No evidence of fracture, dislocation, or joint effusion. No evidence of arthropathy or other focal bone abnormality. Soft tissues are unremarkable. IMPRESSION: Normal left knee. Electronically Signed   By: Lupita RaiderJames  Green Jr, M.D.   On: 04/18/2017 13:33    Procedures Procedures (including critical care time)  Medications Ordered in ED Medications - No data to display   Initial Impression / Assessment and Plan / ED Course  I have reviewed the triage vital signs and the nursing notes.  Pertinent labs & imaging results that were available during my care of the patient were reviewed by me and considered in my medical decision making (see chart for details).     Patient with left anterior medial knee pain at the meniscus, no palpable deformity, crepitus, normal imaging.  Discussed avoiding kneeling, work note given to assist with this activity.  Ice, activity as tolerated, ibuprofen.  Patient was referred to orthopedics for further evaluation if symptoms persist or worsen.  Final Clinical Impressions(s) / ED Diagnoses   Final diagnoses:  Knee strain, left, initial encounter    ED Discharge Orders        Ordered    ibuprofen (ADVIL,MOTRIN) 600 MG tablet  3 times daily     04/18/17 1450      Burgess Amordol, Forrester Blando, PA-C 04/18/17 1624  Gerhard MunchLockwood, Robert, MD 04/19/17 [phone]

## 2017-04-18 NOTE — Discharge Instructions (Signed)
As discussed,  use ice as much as is comfortable for the next few days.  Avoid any activity that worsens your pain including kneeling.  Use the medicine for pain and inflammation (this will not make you drowsy).

## 2017-04-23 ENCOUNTER — Emergency Department (HOSPITAL_COMMUNITY): Payer: Worker's Compensation

## 2017-04-23 ENCOUNTER — Other Ambulatory Visit: Payer: Self-pay

## 2017-04-23 ENCOUNTER — Encounter (HOSPITAL_COMMUNITY): Payer: Self-pay | Admitting: Emergency Medicine

## 2017-04-23 ENCOUNTER — Emergency Department (HOSPITAL_COMMUNITY)
Admission: EM | Admit: 2017-04-23 | Discharge: 2017-04-23 | Disposition: A | Discharge status: Home / Self Care | Payer: Worker's Compensation | Attending: Emergency Medicine | Admitting: Emergency Medicine

## 2017-04-23 DIAGNOSIS — I10 Essential (primary) hypertension: Secondary | ICD-10-CM | POA: Insufficient documentation

## 2017-04-23 DIAGNOSIS — Y990 Civilian activity done for income or pay: Secondary | ICD-10-CM | POA: Insufficient documentation

## 2017-04-23 DIAGNOSIS — S39012A Strain of muscle, fascia and tendon of lower back, initial encounter: Secondary | ICD-10-CM | POA: Insufficient documentation

## 2017-04-23 DIAGNOSIS — X509XXA Other and unspecified overexertion or strenuous movements or postures, initial encounter: Secondary | ICD-10-CM | POA: Insufficient documentation

## 2017-04-23 DIAGNOSIS — Y929 Unspecified place or not applicable: Secondary | ICD-10-CM | POA: Diagnosis not present

## 2017-04-23 DIAGNOSIS — M545 Low back pain: Secondary | ICD-10-CM | POA: Diagnosis present

## 2017-04-23 DIAGNOSIS — Y939 Activity, unspecified: Secondary | ICD-10-CM | POA: Insufficient documentation

## 2017-04-23 DIAGNOSIS — Z79899 Other long term (current) drug therapy: Secondary | ICD-10-CM | POA: Diagnosis not present

## 2017-04-23 DIAGNOSIS — Z7982 Long term (current) use of aspirin: Secondary | ICD-10-CM | POA: Insufficient documentation

## 2017-04-23 DIAGNOSIS — Z87891 Personal history of nicotine dependence: Secondary | ICD-10-CM | POA: Diagnosis not present

## 2017-04-23 MED ORDER — METHOCARBAMOL 500 MG PO TABS: 500.0000 mg | ORAL_TABLET | Freq: Three times a day (TID) | ORAL | 0 refills | Status: DC

## 2017-04-23 MED ORDER — TRAMADOL HCL 50 MG PO TABS: 50.0000 mg | ORAL_TABLET | Freq: Four times a day (QID) | ORAL | 0 refills | Status: DC | PRN

## 2017-04-23 MED ORDER — NAPROXEN 500 MG PO TABS: 500.0000 mg | ORAL_TABLET | Freq: Two times a day (BID) | ORAL | 0 refills | Status: DC

## 2017-04-23 NOTE — ED Triage Notes (Signed)
Pt states pulled 80lb machine yesterday at work hurting left side back and pain going down leg some.pt is ambulatory with difficulty due to pain.

## 2017-04-23 NOTE — Discharge Instructions (Signed)
Alternate ice and heat to your back.  Avoid bending or twisting movements for 1 week.  Follow-up with your primary doctor back for recheck if symptoms are not improving or worsen.

## 2017-04-23 NOTE — ED Notes (Signed)
Family at bedside. 

## 2017-04-23 NOTE — ED Provider Notes (Signed)
[facility] Provider Note   CSN: [id number] Arrival date & time: 04/23/17  [id number]     History   Chief Complaint Chief Complaint  Patient presents with  . Back Pain    HPI Patrick Nielsen is a 66 y.o. male.  HPI   Patrick Nielsen is a 66 y.o. male who presents to the Emergency Department complaining of left low back pain for one day.  States he was pulling on a 80 pound object at work when he felt a pulling sensation to his left lower back.  Pain radiates into his left buttock and upper left thigh.  Pain is worse with weightbearing and certain movements.  He is tried over-the-counter pain relievers without relief.  He denies fall, abdominal pain, numbness or weakness of the lower extremities, urine or bowel changes.  No other injuries.  He was seen here last week for a knee injury that was also a workers comp issue   Past Medical History:  Diagnosis Date  . High cholesterol   . Hypertension     There are no active problems to display for this patient.   History reviewed. No pertinent surgical history.     Home Medications    Prior to Admission medications   Medication Sig Start Date End Date Taking? Authorizing Provider  amLODipine (NORVASC) 10 MG tablet Take 10 mg by mouth daily. 11/14/15   [provider]  aspirin EC 81 MG tablet Take 81 mg by mouth daily.    [provider]  ibuprofen (ADVIL,MOTRIN) 600 MG tablet Take 1 tablet (600 mg total) by mouth 3 (three) times daily. 04/18/17   Burgess AmorIdol, Julie, PA-C  losartan-hydrochlorothiazide (HYZAAR) 100-25 MG tablet Take 1 tablet by mouth daily. 11/19/15   [provider]  naproxen (NAPROSYN) 500 MG tablet Take 1 tablet (500 mg total) by mouth 2 (two) times daily. 02/26/17   Burgess AmorIdol, Julie, PA-C  Omega-3 Fatty Acids (FISH OIL PO) Take 1 capsule by mouth daily.    [provider]  simvastatin (ZOCOR) 20 MG tablet Take 20 mg by mouth daily. 11/19/15   [provider]    Family  History History reviewed. No pertinent family history.  Social History Social History   Tobacco Use  . Smoking status: Former Smoker    Packs/day: 2.00    Years: 28.00    Pack years: 56.00    Types: Cigarettes    Last attempt to quit: 05/26/1996    Years since quitting: 20.9  . Smokeless tobacco: Never Used  Substance Use Topics  . Alcohol use: Yes    Alcohol/week: 7.2 oz    Types: 12 Cans of beer per week    Comment: only on weekends  . Drug use: No     Allergies   Patient has no known allergies.   Review of Systems Review of Systems  Constitutional: Negative for fever.  Respiratory: Negative for shortness of breath.   Gastrointestinal: Negative for abdominal pain, constipation and vomiting.  Genitourinary: Negative for decreased urine volume, difficulty urinating, dysuria, flank pain and hematuria.  Musculoskeletal: Positive for back pain. Negative for joint swelling.  Skin: Negative for rash.  Neurological: Negative for weakness and numbness.  All other systems reviewed and are negative.    Physical Exam Updated Vital Signs BP 124/65 (BP Location: Right Arm)   Pulse 96   Temp 98.1 F (36.7 C) (Oral)   Resp 16   Ht 5\' 6"  (1.676 m)   Wt 80.7 kg (178  lb)   SpO2 99%   BMI 28.73 kg/m   Physical Exam  Constitutional: He is oriented to person, place, and time. He appears well-developed and well-nourished. No distress.  HENT:  Head: Normocephalic and atraumatic.  Neck: Normal range of motion. Neck supple.  Cardiovascular: Normal rate, regular rhythm and intact distal pulses.  No murmur heard. Strong DP pulses bilaterally  Pulmonary/Chest: Effort normal and breath sounds normal. No respiratory distress.  Abdominal: Soft. He exhibits no distension. There is no tenderness.  Musculoskeletal: He exhibits tenderness. He exhibits no edema.       Lumbar back: He exhibits tenderness and pain. He exhibits normal range of motion, no swelling, no deformity, no laceration  and normal pulse.  ttp of the left lower lumbar paraspinal muscles and SI joint. Positive SLR at 30 degrees.  Pt has 5/5 strength against resistance of bilateral lower extremities.     Neurological: He is alert and oriented to person, place, and time. He has normal strength. No sensory deficit. He exhibits normal muscle tone. Coordination and gait normal.  Reflex Scores:      Patellar reflexes are 2+ on the right side and 2+ on the left side.      Achilles reflexes are 2+ on the right side and 2+ on the left side. Skin: Skin is warm and dry. Capillary refill takes less than 2 seconds. No rash noted.  Nursing note and vitals reviewed.    ED Treatments / Results  Labs (all labs ordered are listed, but only abnormal results are displayed) Labs Reviewed - No data to display  EKG  EKG Interpretation None       Radiology Dg Lumbar Spine Complete  Result Date: 04/23/2017 CLINICAL DATA:  Injury, low back pain. EXAM: LUMBAR SPINE - COMPLETE 4+ VIEW COMPARISON:  02/26/2017 FINDINGS: Degenerative disc disease changes at L4-5 and L5-S1 with disc space narrowing and spurring. Degenerative facet disease from L3-4 through L5-S1. Normal alignment. No fracture. Aortic atherosclerosis without aneurysm. IMPRESSION: Degenerative disc and facet disease in the lower lumbar spine. No acute findings. Aortic atherosclerosis. Electronically Signed   By: Charlett NoseKevin  Dover M.D.   On: 04/23/2017 08:50    Procedures Procedures (including critical care time)  Medications Ordered in ED Medications - No data to display   Initial Impression / Assessment and Plan / ED Course  I have reviewed the triage vital signs and the nursing notes.  Pertinent labs & imaging results that were available during my care of the patient were reviewed by me and considered in my medical decision making (see chart for details).     Patient is ambulatory, focal neuro deficits, no motor weakness.  Pain is likely musculoskeletal.  No  red flags on exam.  Discussed appearance of degenerative disks disease changes and importance of PCP follow-up if needed.  Patient appears stable for discharge, he agrees to close follow-up with his PCP if symptoms persist or worsen.   Final Clinical Impressions(s) / ED Diagnoses   Final diagnoses:  Strain of lumbar region, initial encounter    ED Discharge Orders    None       Pauline Ausriplett, Jahden Schara, PA-C 04/23/17 [id number]    Linwood DibblesKnapp, Jon, MD 04/25/17 [phone]

## 2017-04-23 NOTE — ED Notes (Signed)
ED Provider at bedside. 

## 2017-07-02 ENCOUNTER — Emergency Department (HOSPITAL_COMMUNITY): Payer: Worker's Compensation

## 2017-07-02 ENCOUNTER — Encounter (HOSPITAL_COMMUNITY): Payer: Self-pay

## 2017-07-02 ENCOUNTER — Other Ambulatory Visit: Payer: Self-pay

## 2017-07-02 ENCOUNTER — Emergency Department (HOSPITAL_COMMUNITY)
Admission: EM | Admit: 2017-07-02 | Discharge: 2017-07-02 | Disposition: A | Discharge status: Home / Self Care | Payer: Worker's Compensation | Attending: Emergency Medicine | Admitting: Emergency Medicine

## 2017-07-02 DIAGNOSIS — Z87891 Personal history of nicotine dependence: Secondary | ICD-10-CM | POA: Diagnosis not present

## 2017-07-02 DIAGNOSIS — W228XXA Striking against or struck by other objects, initial encounter: Secondary | ICD-10-CM | POA: Diagnosis not present

## 2017-07-02 DIAGNOSIS — S63501A Unspecified sprain of right wrist, initial encounter: Secondary | ICD-10-CM | POA: Insufficient documentation

## 2017-07-02 DIAGNOSIS — Y9389 Activity, other specified: Secondary | ICD-10-CM | POA: Insufficient documentation

## 2017-07-02 DIAGNOSIS — Y929 Unspecified place or not applicable: Secondary | ICD-10-CM | POA: Insufficient documentation

## 2017-07-02 DIAGNOSIS — S6991XA Unspecified injury of right wrist, hand and finger(s), initial encounter: Secondary | ICD-10-CM | POA: Diagnosis present

## 2017-07-02 DIAGNOSIS — Z79899 Other long term (current) drug therapy: Secondary | ICD-10-CM | POA: Diagnosis not present

## 2017-07-02 DIAGNOSIS — I10 Essential (primary) hypertension: Secondary | ICD-10-CM | POA: Diagnosis not present

## 2017-07-02 DIAGNOSIS — Y999 Unspecified external cause status: Secondary | ICD-10-CM | POA: Insufficient documentation

## 2017-07-02 MED ORDER — KETOROLAC TROMETHAMINE 60 MG/2ML IM SOLN
60.0000 mg | Freq: Once | INTRAMUSCULAR | Status: DC
  Filled 2017-07-02: qty 2

## 2017-07-02 MED ORDER — IBUPROFEN 600 MG PO TABS: 600.0000 mg | ORAL_TABLET | Freq: Three times a day (TID) | ORAL | 0 refills | Status: DC | PRN

## 2017-07-02 NOTE — ED Provider Notes (Signed)
[facility] EMERGENCY DEPARTMENT Provider Note   CSN: [id number] Arrival date & time: 07/02/17  0701     History   Chief Complaint Chief Complaint  Patient presents with  . Hand Injury    RIGHT    HPI Patrick Nielsen is a 67 y.o. male.  HPI Patient states that 2 days ago he fell while carrying a ladder.  Thinks he struck his hand.  He is been having pain in his right wrist radiating up into his hand since that time.  Patient is right-hand dominant.  Denies any numbness or tingling.  No other injuries. Past Medical History:  Diagnosis Date  . High cholesterol   . Hypertension     There are no active problems to display for this patient.   History reviewed. No pertinent surgical history.     Home Medications    Prior to Admission medications   Medication Sig Start Date End Date Taking? Authorizing Provider  amLODipine (NORVASC) 10 MG tablet Take 10 mg by mouth daily. 11/14/15   [provider]  aspirin EC 81 MG tablet Take 81 mg by mouth daily.    [provider]  ibuprofen (ADVIL,MOTRIN) 600 MG tablet Take 1 tablet (600 mg total) by mouth 3 (three) times daily with meals as needed. 07/02/17   Loren Racer, MD  losartan-hydrochlorothiazide (HYZAAR) 100-25 MG tablet Take 1 tablet by mouth daily. 11/19/15   [provider]  methocarbamol (ROBAXIN) 500 MG tablet Take 1 tablet (500 mg total) by mouth 3 (three) times daily. 04/23/17   Triplett, Tammy, PA-C  naproxen (NAPROSYN) 500 MG tablet Take 1 tablet (500 mg total) by mouth 2 (two) times daily with a meal. 04/23/17   Triplett, Tammy, PA-C  Omega-3 Fatty Acids (FISH OIL PO) Take 1 capsule by mouth daily.    [provider]  simvastatin (ZOCOR) 20 MG tablet Take 20 mg by mouth daily. 11/19/15   [provider]  traMADol (ULTRAM) 50 MG tablet Take 1 tablet (50 mg total) by mouth every 6 (six) hours as needed. 04/23/17   Pauline Aus, PA-C    Family History No family history on  file.  Social History Social History   Tobacco Use  . Smoking status: Former Smoker    Packs/day: 2.00    Years: 28.00    Pack years: 56.00    Types: Cigarettes    Last attempt to quit: 05/26/1996    Years since quitting: 21.1  . Smokeless tobacco: Never Used  Substance Use Topics  . Alcohol use: Yes    Alcohol/week: 7.2 oz    Types: 12 Cans of beer per week    Comment: only on weekends  . Drug use: No     Allergies   Patient has no known allergies.   Review of Systems Review of Systems  Musculoskeletal: Positive for arthralgias.  Skin: Negative for wound.  Neurological: Negative for weakness and numbness.  All other systems reviewed and are negative.    Physical Exam Updated Vital Signs BP (!) 162/74 (BP Location: Left Arm)   Pulse 82   Temp 98 F (36.7 C) (Oral)   Resp 18   Ht 5\' 6"  (1.676 m)   Wt 83.9 kg (185 lb)   SpO2 96%   BMI 29.86 kg/m   Physical Exam  Constitutional: He is oriented to person, place, and time. He appears well-developed and well-nourished.  HENT:  Head: Normocephalic and atraumatic.  Eyes: EOM are normal. Pupils are equal, round, and  reactive to light.  Neck: Normal range of motion. Neck supple.  Cardiovascular: Normal rate.  Pulmonary/Chest: Effort normal.  Abdominal: Soft.  Musculoskeletal: Normal range of motion. He exhibits tenderness. He exhibits no edema.  Patient has mild tenderness over the dorsum of the right wrist.  No definite snuffbox tenderness.  No obvious deformity.  Normal range of motion of the wrist and elbow.  Good distal cap refill.  Neurological: He is alert and oriented to person, place, and time.  5/5 grip strength bilaterally.  Sensation fully intact.  Skin: Skin is warm and dry. Capillary refill takes less than 2 seconds. No rash noted. No erythema.  Psychiatric: He has a normal mood and affect. His behavior is normal.  Nursing note and vitals reviewed.    ED Treatments / Results  Labs (all labs  ordered are listed, but only abnormal results are displayed) Labs Reviewed - No data to display  EKG  EKG Interpretation None       Radiology Dg Wrist Complete Right  Result Date: 07/02/2017 CLINICAL DATA:  Right wrist pain after fall at work 2 days ago. EXAM: RIGHT WRIST - COMPLETE 3+ VIEW COMPARISON:  None. FINDINGS: There is no evidence of fracture or dislocation. There is no evidence of arthropathy or other focal bone abnormality. Soft tissues are unremarkable. IMPRESSION: Normal right wrist. Electronically Signed   By: Lupita RaiderJames  Green Jr, M.D.   On: 07/02/2017 08:22    Procedures Procedures (including critical care time)  Medications Ordered in ED Medications  ketorolac (TORADOL) injection 60 mg (not administered)     Initial Impression / Assessment and Plan / ED Course  I have reviewed the triage vital signs and the nursing notes.  Pertinent labs & imaging results that were available during my care of the patient were reviewed by me and considered in my medical decision making (see chart for details).     Suspect likely wrist strain.  Return yesterday.  Advised RICE.  Placed in wrist splint.  Given referral to orthopedist as needed.  Return precautions given.  Final Clinical Impressions(s) / ED Diagnoses   Final diagnoses:  Sprain of right wrist, initial encounter    ED Discharge Orders        Ordered    ibuprofen (ADVIL,MOTRIN) 600 MG tablet  3 times daily with meals PRN     07/02/17 0829       Loren RacerYelverton, Nara Paternoster, MD 07/02/17 0830

## 2017-07-02 NOTE — ED Triage Notes (Signed)
Patient reports of carrying ladder Tuesday and fell hitting right hand on machine. Complains of right hand/wrist pain. Took pain pill this morning at 0300 with relief.

## 2017-10-26 IMAGING — US US CAROTID DUPLEX BILAT
1 series · 13 of 24 positions shown · non-contrast
Comparison: 03/10/2014

CLINICAL DATA: Asymptomatic carotid bruit

EXAM:
BILATERAL CAROTID DUPLEX ULTRASOUND
TECHNIQUE: Gray scale imaging, color Doppler and duplex ultrasound were
performed of bilateral carotid and vertebral arteries in the neck.

[Series 1: us carotid duplex bilat · 0.06mm/px · 13 of 68 slices shown]
[im 1/68]
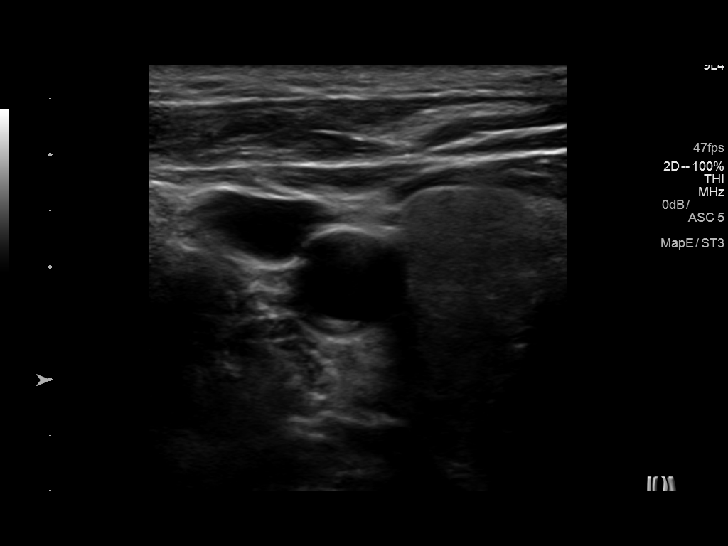
[im 6/68]
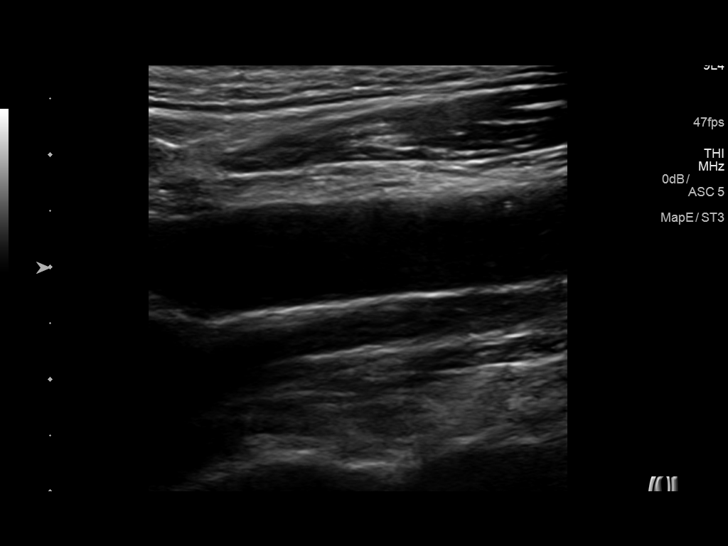
[im 12/68]
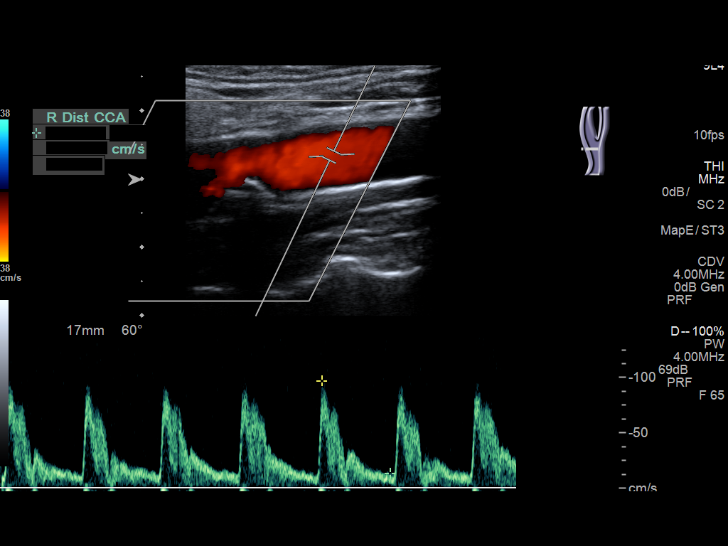
[im 18/68]
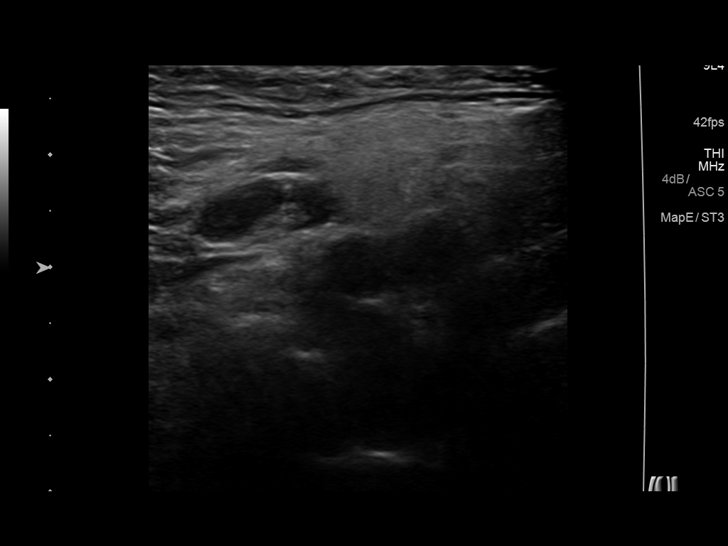
[im 24/68]
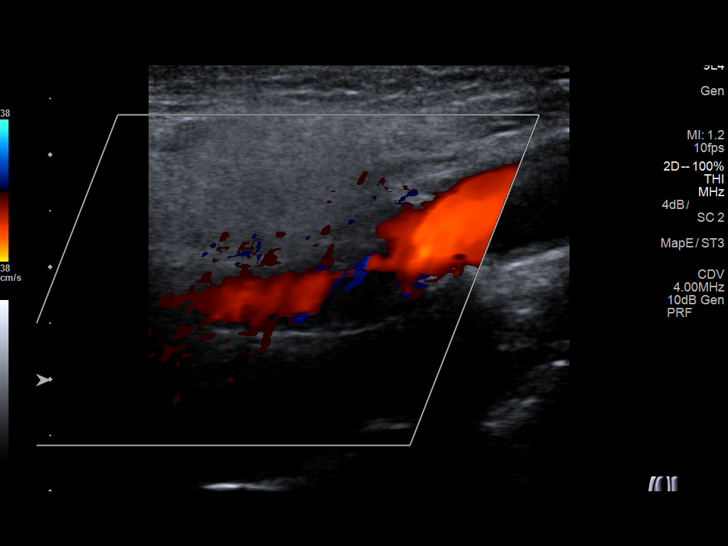
[im 30/68]
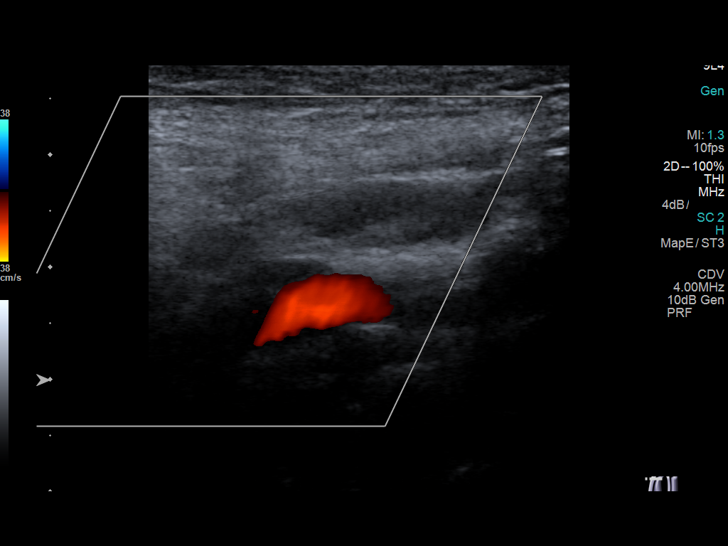
[im 35/68]
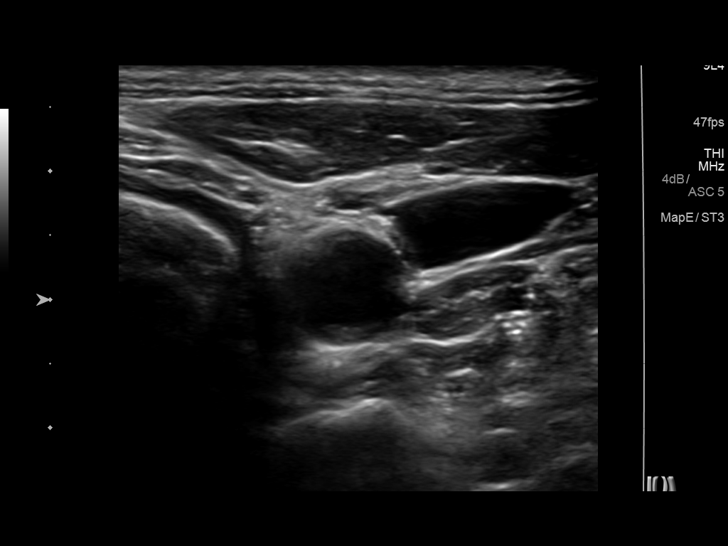
[im 38/68]
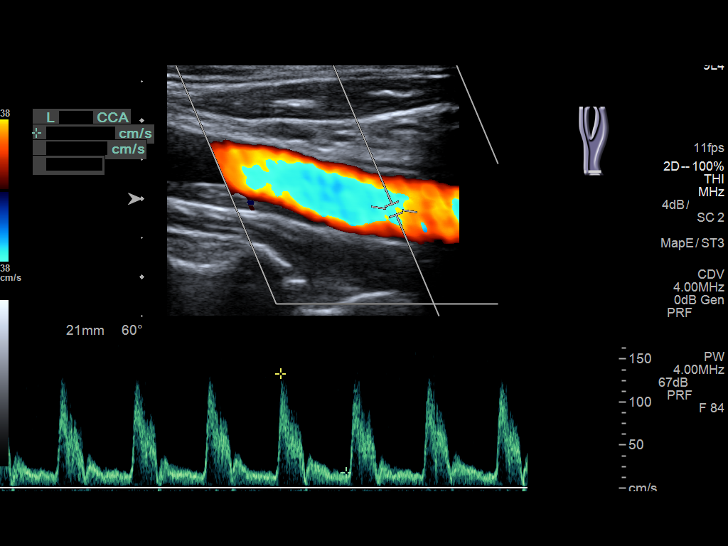
[im 44/68]
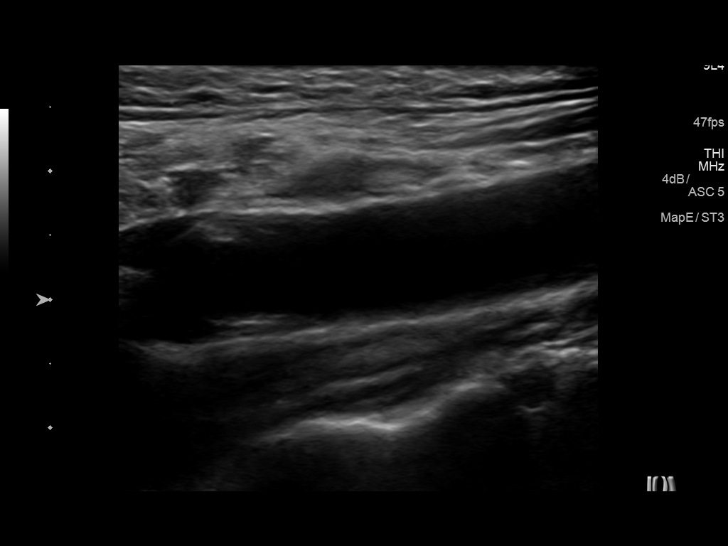
[im 50/68]
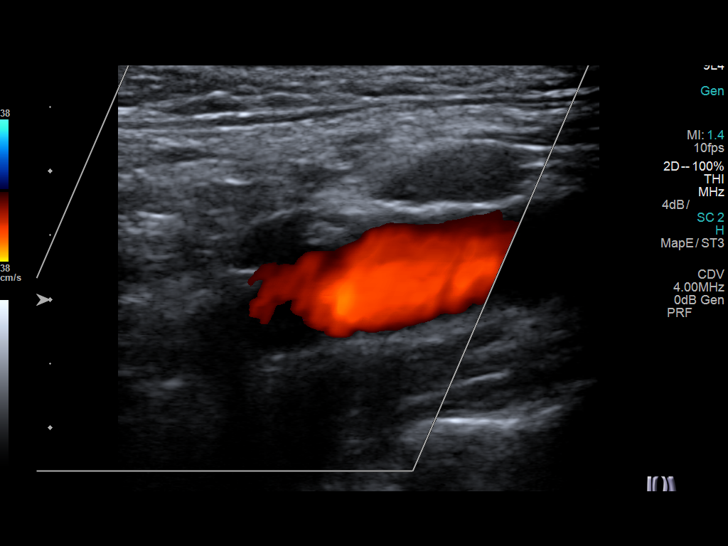
[im 56/68]
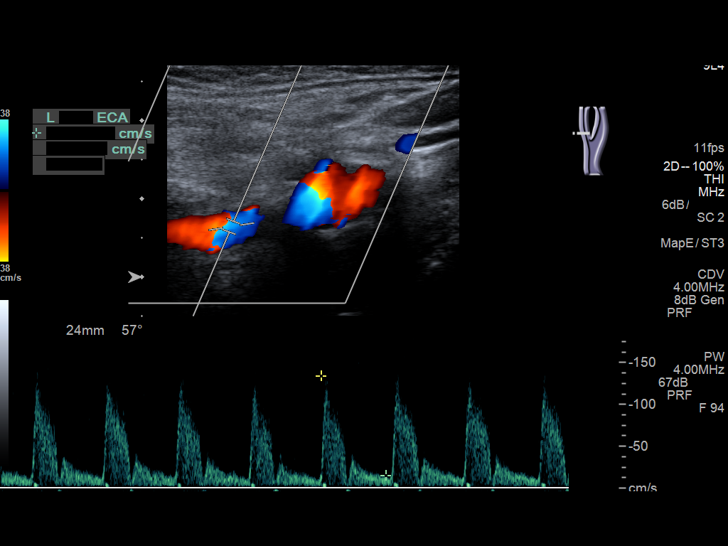
[im 62/68]
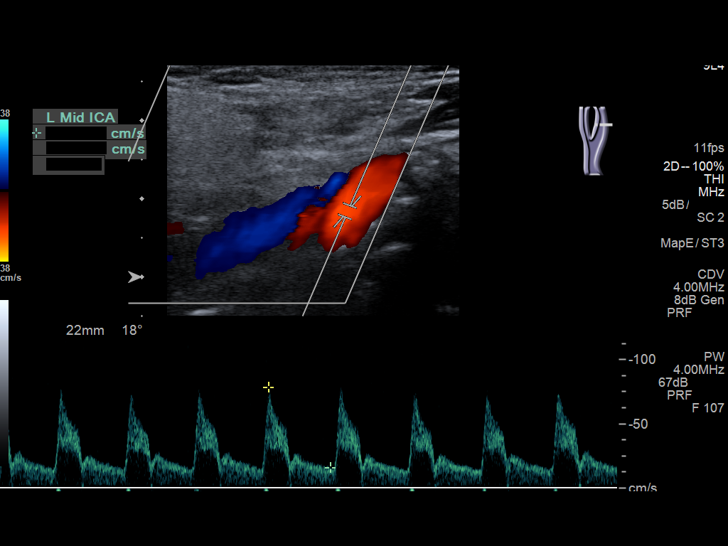
[im 68/68]
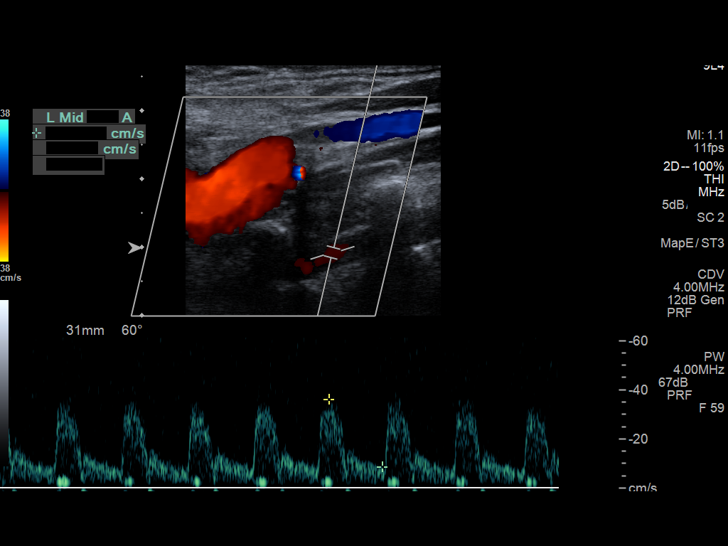

[13 of 24 positions shown; findings below may reference images not displayed]

FINDINGS: Criteria: Quantification of carotid stenosis is based on velocity
parameters that correlate the residual internal carotid diameter
with NASCET-based stenosis levels, using the diameter of the distal
internal carotid lumen as the denominator for stenosis measurement.

The following velocity measurements were obtained:

RIGHT

ICA:  137/21 cm/sec

CCA:  105/14 cm/sec

SYSTOLIC ICA/CCA RATIO:

DIASTOLIC ICA/CCA RATIO:

ECA:  166 cm/sec

LEFT

ICA:  120/23 cm/sec

CCA:  136/24 cm/sec

SYSTOLIC ICA/CCA RATIO:

DIASTOLIC ICA/CCA RATIO:

ECA:  134 cm/sec

RIGHT CAROTID ARTERY: Moderate heterogeneous partially calcified
carotid bifurcation atherosclerosis. This narrows the proximal right
ICA lumen by grayscale imaging. Mild velocity elevation measuring
137/21 cm/sec. No significant turbulent flow. By ultrasound
criteria, right ICA stenosis estimated at less than 50%.

RIGHT VERTEBRAL ARTERY:  Antegrade

LEFT CAROTID ARTERY: Mild left carotid bifurcation partially
calcified atherosclerosis. Mild luminal narrowing by grayscale
imaging. Despite this, no hemodynamically significant left ICA
stenosis, velocity elevation, or turbulent flow. Degree of narrowing
also less than 50%.

LEFT VERTEBRAL ARTERY:  Antegrade
IMPRESSION: Bilateral carotid atherosclerosis. No hemodynamically significant
ICA stenosis. Degree of narrowing less than 50% bilaterally.

Patent antegrade vertebral flow bilaterally

## 2018-04-10 ENCOUNTER — Encounter (HOSPITAL_COMMUNITY): Payer: Self-pay | Admitting: Emergency Medicine

## 2018-04-10 ENCOUNTER — Other Ambulatory Visit: Payer: Self-pay

## 2018-04-10 ENCOUNTER — Emergency Department (HOSPITAL_COMMUNITY)
Admission: EM | Admit: 2018-04-10 | Discharge: 2018-04-10 | Disposition: A | Discharge status: Home / Self Care | Payer: Worker's Compensation | Attending: Emergency Medicine | Admitting: Emergency Medicine

## 2018-04-10 DIAGNOSIS — Z79899 Other long term (current) drug therapy: Secondary | ICD-10-CM | POA: Insufficient documentation

## 2018-04-10 DIAGNOSIS — X500XXA Overexertion from strenuous movement or load, initial encounter: Secondary | ICD-10-CM | POA: Diagnosis not present

## 2018-04-10 DIAGNOSIS — Y999 Unspecified external cause status: Secondary | ICD-10-CM | POA: Diagnosis not present

## 2018-04-10 DIAGNOSIS — S39012A Strain of muscle, fascia and tendon of lower back, initial encounter: Secondary | ICD-10-CM

## 2018-04-10 DIAGNOSIS — S3982XA Other specified injuries of lower back, initial encounter: Secondary | ICD-10-CM | POA: Diagnosis present

## 2018-04-10 DIAGNOSIS — Y939 Activity, unspecified: Secondary | ICD-10-CM | POA: Diagnosis not present

## 2018-04-10 DIAGNOSIS — I10 Essential (primary) hypertension: Secondary | ICD-10-CM | POA: Diagnosis not present

## 2018-04-10 DIAGNOSIS — Z7982 Long term (current) use of aspirin: Secondary | ICD-10-CM | POA: Diagnosis not present

## 2018-04-10 DIAGNOSIS — M47816 Spondylosis without myelopathy or radiculopathy, lumbar region: Secondary | ICD-10-CM | POA: Diagnosis not present

## 2018-04-10 DIAGNOSIS — S29012A Strain of muscle and tendon of back wall of thorax, initial encounter: Secondary | ICD-10-CM | POA: Diagnosis not present

## 2018-04-10 DIAGNOSIS — Z87891 Personal history of nicotine dependence: Secondary | ICD-10-CM | POA: Diagnosis not present

## 2018-04-10 DIAGNOSIS — Y929 Unspecified place or not applicable: Secondary | ICD-10-CM | POA: Diagnosis not present

## 2018-04-10 MED ORDER — PREDNISONE 20 MG PO TABS: 20.0000 mg | ORAL_TABLET | Freq: Two times a day (BID) | ORAL | 0 refills | Status: DC

## 2018-04-10 MED ORDER — DIAZEPAM 5 MG PO TABS: 5.0000 mg | ORAL_TABLET | Freq: Three times a day (TID) | ORAL | 0 refills | Status: DC | PRN

## 2018-04-10 NOTE — Discharge Instructions (Addendum)
Pain in your back is likely related to overexertion, with worsening of an arthritic condition of your lower spine.  To treat this, resting in bed for a few days.  Use heat on the sore areas 3 or 4 times a day.  We are prescribing medication for inflammation, and a muscle relaxer.  For pain take Tylenol every 4 hours.  Follow-up with your doctor for checkup in a few days.

## 2018-04-10 NOTE — ED Triage Notes (Signed)
Pt states he was carrying a 20 ft ladder on Thursday and has been having low back pain since with hx of same.

## 2018-04-10 NOTE — ED Provider Notes (Signed)
[facility] EMERGENCY DEPARTMENT Provider Note   CSN: [id number] Arrival date & time: 04/10/18  4098     History   Chief Complaint Chief Complaint  Patient presents with  . Back Pain    HPI Patrick Nielsen is a 67 y.o. male.  HPI   He presents for evaluation of back pain, following lifting a heavy letter several days ago.  Pain is persistent, despite taking unknown pain medicine that he had leftover from a prior injury.  He denies bowel or bladder incontinence.  He denies pain or paresthesia in the legs.  He denies fever, chills, nausea or vomiting.  There are no other known modifying factors.  Past Medical History:  Diagnosis Date  . High cholesterol   . Hypertension     There are no active problems to display for this patient.   History reviewed. No pertinent surgical history.      Home Medications    Prior to Admission medications   Medication Sig Start Date End Date Taking? Authorizing Provider  amLODipine (NORVASC) 10 MG tablet Take 10 mg by mouth daily. 11/14/15   [provider]  aspirin EC 81 MG tablet Take 81 mg by mouth daily.    [provider]  diazepam (VALIUM) 5 MG tablet Take 1 tablet (5 mg total) by mouth every 8 (eight) hours as needed for muscle spasms (spasms). 04/10/18   Mancel Bale, MD  ibuprofen (ADVIL,MOTRIN) 600 MG tablet Take 1 tablet (600 mg total) by mouth 3 (three) times daily with meals as needed. 07/02/17   Loren Racer, MD  losartan-hydrochlorothiazide (HYZAAR) 100-25 MG tablet Take 1 tablet by mouth daily. 11/19/15   [provider]  methocarbamol (ROBAXIN) 500 MG tablet Take 1 tablet (500 mg total) by mouth 3 (three) times daily. 04/23/17   Triplett, Tammy, PA-C  naproxen (NAPROSYN) 500 MG tablet Take 1 tablet (500 mg total) by mouth 2 (two) times daily with a meal. 04/23/17   Triplett, Tammy, PA-C  Omega-3 Fatty Acids (FISH OIL PO) Take 1 capsule by mouth daily.    [provider]  predniSONE  (DELTASONE) 20 MG tablet Take 1 tablet (20 mg total) by mouth 2 (two) times daily. 04/10/18   Mancel Bale, MD  simvastatin (ZOCOR) 20 MG tablet Take 20 mg by mouth daily. 11/19/15   [provider]  traMADol (ULTRAM) 50 MG tablet Take 1 tablet (50 mg total) by mouth every 6 (six) hours as needed. 04/23/17   Pauline Aus, PA-C    Family History History reviewed. No pertinent family history.  Social History Social History   Tobacco Use  . Smoking status: Former Smoker    Packs/day: 2.00    Years: 28.00    Pack years: 56.00    Types: Cigarettes    Last attempt to quit: 05/26/1996    Years since quitting: 21.8  . Smokeless tobacco: Never Used  Substance Use Topics  . Alcohol use: Yes    Alcohol/week: 12.0 standard drinks    Types: 12 Cans of beer per week    Comment: only on weekends  . Drug use: No     Allergies   Patient has no known allergies.   Review of Systems Review of Systems  All other systems reviewed and are negative.    Physical Exam Updated Vital Signs BP (!) 150/75 (BP Location: Right Arm)   Pulse 96   Temp 98 F (36.7 C) (Oral)   Resp 18   Ht 5\' 6"  (1.676 m)  Wt 83.9 kg   SpO2 97%   BMI 29.86 kg/m   Physical Exam  Constitutional: He is oriented to person, place, and time. He appears well-developed and well-nourished. No distress.  HENT:  Head: Normocephalic and atraumatic.  Right Ear: External ear normal.  Left Ear: External ear normal.  Eyes: Pupils are equal, round, and reactive to light. Conjunctivae and EOM are normal.  Neck: Normal range of motion and phonation normal. Neck supple.  Cardiovascular: Normal rate.  Pulmonary/Chest: Effort normal. He exhibits no bony tenderness.  Musculoskeletal:  He guards against movement from discomfort in the lower back.  He is able to sit on the stretcher.  Negative straight leg raising bilaterally.  No tenderness to palpation of the thoracic or lumbar spines.  Mild left lower lumbar  tenderness to palpation.  Neurological: He is alert and oriented to person, place, and time. No cranial nerve deficit or sensory deficit. He exhibits normal muscle tone. Coordination normal.  Skin: Skin is warm, dry and intact.  Psychiatric: He has a normal mood and affect. His behavior is normal. Judgment and thought content normal.  Nursing note and vitals reviewed.    ED Treatments / Results  Labs (all labs ordered are listed, but only abnormal results are displayed) Labs Reviewed - No data to display  EKG None  Radiology No results found.  Procedures Procedures (including critical care time)  Medications Ordered in ED Medications - No data to display   Initial Impression / Assessment and Plan / ED Course  I have reviewed the triage vital signs and the nursing notes.  Pertinent labs & imaging results that were available during my care of the patient were reviewed by me and considered in my medical decision making (see chart for details).      Patient Vitals for the past 24 hrs:  BP Temp Temp src Pulse Resp SpO2 Height Weight  04/10/18 0910 (!) 150/75 98 F (36.7 C) Oral 96 18 97 % - -  04/10/18 0909 - - - - - - 5\' 6"  (1.676 m) 83.9 kg    9:25 AM Reevaluation with update and discussion. After initial assessment and treatment, an updated evaluation reveals no change in clinical status.  Findings discussed with the patient, as well as a person with him, and all questions were answered. Mancel Bale   Medical Decision Making: Pain with pre-existing degenerative changes in the lumbar spine, seen on prior imaging in 2018.  No clinical evidence for radiculopathy or myelopathy.  Doubt fracture.  No indication for further evaluation or treatment in the ED setting.  Patient can be treated as an outpatient with symptomatic treatment for inflammation and muscle strain, while his injury spontaneously improves; which is the expected course of this discomfort.  CRITICAL  CARE-no Performed by: Mancel Bale  Nursing Notes Reviewed/ Care Coordinated Applicable Imaging Reviewed Interpretation of Laboratory Data incorporated into ED treatment  The patient appears reasonably screened and/or stabilized for discharge and I doubt any other medical condition or other [facility] requiring further screening, evaluation, or treatment in the ED at this time prior to discharge.  Plan: Home Medications-continue usual medicines, use Tylenol for pain; Home Treatments-rest, heat; return here if the recommended treatment, does not improve the symptoms; Recommended follow up-PCP, PRN  Final Clinical Impressions(s) / ED Diagnoses   Final diagnoses:  Back strain, initial encounter  Spondylosis of lumbar region without myelopathy or radiculopathy    ED Discharge Orders         Ordered  predniSONE (DELTASONE) 20 MG tablet  2 times daily     04/10/18 0921    diazepam (VALIUM) 5 MG tablet  Every 8 hours PRN     04/10/18 0921           Mancel BaleWentz, Felecia Stanfill, MD 04/10/18 [phone]

## 2018-08-10 ENCOUNTER — Other Ambulatory Visit (HOSPITAL_COMMUNITY): Payer: Self-pay | Admitting: Internal Medicine

## 2018-08-10 ENCOUNTER — Other Ambulatory Visit: Payer: Self-pay | Admitting: Internal Medicine

## 2018-08-10 DIAGNOSIS — Z87891 Personal history of nicotine dependence: Secondary | ICD-10-CM

## 2018-08-13 ENCOUNTER — Encounter (INDEPENDENT_AMBULATORY_CARE_PROVIDER_SITE_OTHER): Payer: Self-pay | Admitting: Internal Medicine

## 2018-08-19 ENCOUNTER — Encounter (HOSPITAL_COMMUNITY): Payer: Self-pay

## 2018-08-19 ENCOUNTER — Ambulatory Visit (HOSPITAL_COMMUNITY): Admission: RE | Admit: 2018-08-19 | Payer: Self-pay | Source: Ambulatory Visit

## 2018-10-11 ENCOUNTER — Ambulatory Visit (INDEPENDENT_AMBULATORY_CARE_PROVIDER_SITE_OTHER): Payer: Self-pay | Admitting: Internal Medicine

## 2019-03-02 ENCOUNTER — Ambulatory Visit (INDEPENDENT_AMBULATORY_CARE_PROVIDER_SITE_OTHER): Payer: Medicare HMO | Admitting: Internal Medicine

## 2019-03-02 ENCOUNTER — Other Ambulatory Visit: Payer: Self-pay

## 2019-03-02 ENCOUNTER — Encounter (INDEPENDENT_AMBULATORY_CARE_PROVIDER_SITE_OTHER): Payer: Self-pay | Admitting: Internal Medicine

## 2019-03-02 DIAGNOSIS — E782 Mixed hyperlipidemia: Secondary | ICD-10-CM

## 2019-03-02 DIAGNOSIS — I10 Essential (primary) hypertension: Secondary | ICD-10-CM | POA: Diagnosis not present

## 2019-03-02 DIAGNOSIS — E559 Vitamin D deficiency, unspecified: Secondary | ICD-10-CM | POA: Diagnosis not present

## 2019-03-02 DIAGNOSIS — E785 Hyperlipidemia, unspecified: Secondary | ICD-10-CM

## 2019-03-02 HISTORY — DX: Hyperlipidemia, unspecified: E78.5

## 2019-03-02 HISTORY — DX: Essential (primary) hypertension: I10

## 2019-03-02 HISTORY — DX: Vitamin D deficiency, unspecified: E55.9

## 2019-03-02 MED ORDER — AMLODIPINE BESYLATE 5 MG PO TABS: 5.0000 mg | ORAL_TABLET | Freq: Every day | ORAL | 0 refills | Status: DC

## 2019-03-02 NOTE — Progress Notes (Signed)
Wellness Office Visit  Subjective:  Patient ID: Patrick Nielsen, male    DOB: 08-07-1950  Age: 68 y.o. MRN: [id number]  CC: This man comes in for follow-up of his multiple medical problems including hypertension, hyperlipidemia and vitamin D deficiency. HPI On his last visit a couple of months ago, I added amlodipine 2.5 mg to his regimen as his blood pressure was not under control.  He has tolerated this well.  He denies any chest pain, dyspnea or palpitations. His hyperlipidemia has not required statin therapy. He is trying to work on nutrition but was unable to do intermittent fasting as we discussed previously. He continues to take vitamin D3 supplementation for his vitamin D deficiency and his last blood work showed good vitamin D levels.  Past Medical History:  Diagnosis Date  . Essential hypertension, benign 03/02/2019  . HLD (hyperlipidemia) 03/02/2019  . Vitamin D deficiency disease 03/02/2019      History reviewed. No pertinent family history.  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     Current Meds  Medication Sig  . Cholecalciferol (VITAMIN D3) 125 MCG (5000 UT) TABS Take 1 tablet by mouth daily.  Marland Kitchen losartan-hydrochlorothiazide (HYZAAR) 100-25 MG tablet Take 1 tablet by mouth daily.  . Omega-3 Fatty Acids (FISH OIL PO) Take 1 capsule by mouth daily.  . [DISCONTINUED] amLODipine (NORVASC) 10 MG tablet Take 10 mg by mouth daily.  . [DISCONTINUED] aspirin EC 81 MG tablet Take 81 mg by mouth daily.  . [DISCONTINUED] diazepam (VALIUM) 5 MG tablet Take 1 tablet (5 mg total) by mouth every 8 (eight) hours as needed for muscle spasms (spasms).  . [DISCONTINUED] ibuprofen (ADVIL,MOTRIN) 600 MG tablet Take 1 tablet (600 mg total) by mouth 3 (three) times daily with meals as needed.  . [DISCONTINUED] methocarbamol (ROBAXIN) 500 MG tablet Take 1 tablet (500 mg total) by mouth 3 (three) times daily.  .  [DISCONTINUED] naproxen (NAPROSYN) 500 MG tablet Take 1 tablet (500 mg total) by mouth 2 (two) times daily with a meal.  . [DISCONTINUED] predniSONE (DELTASONE) 20 MG tablet Take 1 tablet (20 mg total) by mouth 2 (two) times daily.  . [DISCONTINUED] simvastatin (ZOCOR) 20 MG tablet Take 20 mg by mouth daily.  . [DISCONTINUED] traMADol (ULTRAM) 50 MG tablet Take 1 tablet (50 mg total) by mouth every 6 (six) hours as needed.      Objective:   Today's Vitals: BP (!) 146/88 (BP Location: Left Arm, Patient Position: Sitting, Cuff Size: Normal) Comment: just came from work  Pulse 68   Temp 99.2 F (37.3 C) (Temporal)   Resp 18   Ht 5\' 6"  (1.676 m)   Wt 189 lb 3.2 oz (85.8 kg)   SpO2 92% Comment: rm air  BMI 30.54 kg/m  Vitals with BMI 03/02/2019 04/10/2018 07/02/2017  Height 5\' 6"  5\' 6"  -  Weight 189 lbs 3 oz 185 lbs -  BMI 30.55 29.87 -  Systolic 146 150 08/30/2017  Diastolic 88 75 74  Pulse 68 96 77     Physical Exam    He looks systemically well.  No new physical findings today and he is alert and orientated.   Assessment   1. Essential hypertension, benign   2. Mixed hyperlipidemia   3. Vitamin D deficiency disease       Tests ordered No orders of the defined types were placed in this encounter.    Plan: 1.  His blood pressure is still not well controlled and I am going to increase the amlodipine to 5 mg daily.  He will take 2 tablets of the 2.5 mg and I have sent a new prescription for amlodipine 5 mg daily.  I have given him instructions to monitor his blood pressure at home and alert [place] if his blood pressure does not improve.  This is in the after visit summary. 2. He will continue with vitamin D3 supplementation as before. 3. He will continue to work on nutrition for his hyperlipidemia and to lose weight.  I have given him a diet sheet regarding this. 4. I will have him follow-up with Judson Roch in December to keep a close monitor on his blood pressure. 5. Today he was given  influenza vaccination and Tdap vaccination.     Doree Albee, MD

## 2019-03-02 NOTE — Patient Instructions (Addendum)
Please check your blood pressure on Mondays, Wednesdays and Fridays and let us know if the blood pressure is consistently above 130/80.     Patrick Nielsen Optimal [facility] Dietary Recommendations for Weight Loss What to Avoid . Avoid added sugars o Often added sugar can be found in processed foods such as many condiments, dry cereals, cakes, cookies, chips, crisps, crackers, candies, sweetened drinks, etc.  o Read labels and AVOID/DECREASE use of foods with the following in their ingredient list: Sugar, fructose, high fructose corn syrup, sucrose, glucose, maltose, dextrose, molasses, cane sugar, brown sugar, any type of syrup, agave nectar, etc.   . Avoid snacking in between meals . Avoid foods made with flour o If you are going to eat food made with flour, choose those made with whole-grains; and, minimize your consumption as much as is tolerable . Avoid processed foods o These foods are generally stocked in the middle of the grocery store. Focus on shopping on the perimeter of the grocery.  What to Include . Vegetables o GREEN LEAFY VEGETABLES: Kale, spinach, mustard greens, collard greens, cabbage, broccoli, etc. o OTHER: Asparagus, cauliflower, eggplant, carrots, peas, Brussel sprouts, tomatoes, bell peppers, zucchini, beets, cucumbers, etc. . Grains, seeds, and legumes o Beans: kidney beans, black eyed peas, garbanzo beans, black beans, pinto beans, etc. o Whole, unrefined grains: brown rice, barley, bulgur, oatmeal, etc. . Healthy fats  o Avoid highly processed fats such as vegetable oil o Examples of healthy fats: avocado, olives, virgin olive oil, dark chocolate (?72% Cocoa), nuts (peanuts, almonds, walnuts, cashews, pecans, etc.) . Low - Moderate Intake of Animal Sources of Protein o Meat sources: chicken, [place], salmon, tuna. Limit to 4 ounces of meat at one time. o Consider limiting dairy sources, but when choosing dairy focus on: PLAIN [place] yogurt, cottage cheese, high-protein  milk . Fruit o Choose berries  When to Eat . Intermittent Fasting: o Choosing not to eat for a specific time period, but DO FOCUS ON HYDRATION when fasting o Multiple Techniques: - Time Restricted Eating: eat 3 meals in a day, each meal lasting no more than 60 minutes, no snacks between meals - 16-18 hour fast: fast for 16 to 18 hours up to 7 days a week. Often suggested to start with 2-3 nonconsecutive days per week.  . Remember the time you sleep is counted as fasting.  . Examples of eating schedule: Fast from 7:00pm-11:00am. Eat between 11:00am-7:00pm.  - 24-hour fast: fast for 24 hours up to every other day. Often suggested to start with 1 day per week . Remember the time you sleep is counted as fasting . Examples of eating schedule:  o Eating day: eat 2-3 meals on your eating day. If doing 2 meals, each meal should last no more than 90 minutes. If doing 3 meals, each meal should last no more than 60 minutes. Finish last meal by 7:00pm. o Fasting day: Fast until 7:00pm.  o IF YOU FEEL UNWELL FOR ANY REASON/IN ANY WAY WHEN FASTING, STOP FASTING BY EATING A NUTRITIOUS SNACK OR LIGHT MEAL o ALWAYS FOCUS ON HYDRATION DURING FASTS - Acceptable Hydration sources: water, broths, tea/coffee (black tea/coffee is best but using a small amount of whole-fat dairy products in coffee/tea is acceptable).  - Poor Hydration Sources: anything with sugar or artificial sweeteners added to it  These recommendations have been developed for patients that are actively receiving medical care from either Dr. Karilyn Cota or Jiles Prows, DNP, NP-C at [facility]. These recommendations are developed for  patients with specific medical conditions and are not meant to be distributed or used by others that are not actively receiving care from either provider listed above at [facility]. It is not appropriate to participate in the above eating plans without proper medical supervision.   Reference: Rexanne Mano.  The obesity code. [place]; 2016.

## 2019-04-05 ENCOUNTER — Other Ambulatory Visit (INDEPENDENT_AMBULATORY_CARE_PROVIDER_SITE_OTHER): Payer: Self-pay | Admitting: Internal Medicine

## 2019-05-04 ENCOUNTER — Ambulatory Visit (INDEPENDENT_AMBULATORY_CARE_PROVIDER_SITE_OTHER): Payer: Medicare HMO | Admitting: Nurse Practitioner

## 2019-05-04 ENCOUNTER — Encounter (INDEPENDENT_AMBULATORY_CARE_PROVIDER_SITE_OTHER): Payer: Self-pay | Admitting: Nurse Practitioner

## 2019-05-04 ENCOUNTER — Other Ambulatory Visit: Payer: Self-pay

## 2019-05-04 VITALS — BP 136/64 | HR 84 | Temp 98.0°F | Resp 14 | Ht 66.0 in | Wt 197.2 lb

## 2019-05-04 DIAGNOSIS — E782 Mixed hyperlipidemia: Secondary | ICD-10-CM

## 2019-05-04 DIAGNOSIS — I10 Essential (primary) hypertension: Secondary | ICD-10-CM | POA: Diagnosis not present

## 2019-05-04 DIAGNOSIS — E559 Vitamin D deficiency, unspecified: Secondary | ICD-10-CM | POA: Diagnosis not present

## 2019-05-04 MED ORDER — ATORVASTATIN CALCIUM 10 MG PO TABS: 10.0000 mg | ORAL_TABLET | Freq: Every evening | ORAL | 1 refills | Status: DC

## 2019-05-04 NOTE — Progress Notes (Signed)
Subjective:  Patient ID: Patrick Nielsen, male    DOB: 11-23-1950  Age: 68 y.o. MRN: [id number]  CC:  Chief Complaint  Patient presents with  . Follow-up      HPI  This patient comes in today for follow-up of his chronic conditions.  Hypertension: At his last office visit his amlodipine was increased from 2.5 mg daily to 5 mg daily.  He is also on losartan/hydrochlorothiazide.  He tells me he has been tolerating these medications well.  He is also been checking his blood pressure at home.  He brings in his at home log.  Average systolic pressures over 59 readings was 131.  Only 1 diastolic pressure was above 90 among his at home readings.  Hyperlipidemia: Last lipid panel was collected in August 2020.  Total cholesterol was 236, HDL was 55, triglycerides were 199, and LDL was 147.  Based on this data and his at home readings patient's ASCVD risk score is approximately 18%.  He is not on a low-dose aspirin nor is he on statin therapy.  He tells me he has not ever been on statin therapy previously.  He was on a low-dose aspirin in the past.  He denies any history of bleeding on aspirin.  He does continue on a fish oil.  Vitamin D deficiency: He continues on vitamin D supplementation.  Last serum level was collected in August 2020 and showed level of 60.  Past Medical History:  Diagnosis Date  . Essential hypertension, benign 03/02/2019  . HLD (hyperlipidemia) 03/02/2019  . Vitamin D deficiency disease 03/02/2019      No family history on file.  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   Social History   Tobacco Use  . Smoking status: Former Smoker    Packs/day: 2.00    Years: 28.00    Pack years: 56.00    Types: Cigarettes    Quit date: 05/26/1996    Years since quitting: 22.9  . Smokeless tobacco: Never Used  Substance Use Topics  . Alcohol use: Yes    Alcohol/week: 12.0 standard drinks    Types:  12 Cans of beer per week    Comment: only on weekends     Current Meds  Medication Sig  . amLODipine (NORVASC) 5 MG tablet Take 1 tablet (5 mg total) by mouth daily.  . Cholecalciferol (VITAMIN D3) 125 MCG (5000 UT) TABS Take 1 tablet by mouth daily.  . fluticasone (FLONASE) 50 MCG/ACT nasal spray SPRAY 1 SPRAY INTO EACH NOSTRIL EVERY DAY  . losartan-hydrochlorothiazide (HYZAAR) 100-25 MG tablet Take 1 tablet by mouth daily.  . Omega-3 Fatty Acids (FISH OIL PO) Take 1 capsule by mouth daily.    ROS:  Review of Systems  Constitutional: Negative.   Respiratory: Negative.   Cardiovascular: Negative.   Neurological: Negative.      Objective:   Today's Vitals: BP (!) 158/74   Pulse 84   Temp 98 F (36.7 C)   Resp 14   Ht 5\' 6"  (1.676 m)   Wt 197 lb 3.2 oz (89.4 kg)   SpO2 97%   BMI 31.83 kg/m  Vitals with BMI 05/04/2019 03/02/2019 04/10/2018  Height 5\' 6"  5\' 6"  5\' 6"   Weight 197 lbs 3 oz 189 lbs 3 oz 185 lbs  BMI 31.84 99.83 38.25  Systolic 053 976 734  Diastolic 74 88 75  Pulse 84 68 96  Physical Exam Vitals signs reviewed.  Constitutional:      Appearance: Normal appearance.  HENT:     Head: Normocephalic and atraumatic.  Neck:     Musculoskeletal: Neck supple.  Cardiovascular:     Rate and Rhythm: Normal rate and regular rhythm.  Pulmonary:     Effort: Pulmonary effort is normal.     Breath sounds: Normal breath sounds.  Skin:    General: Skin is warm and dry.  Neurological:     Mental Status: He is alert and oriented to person, place, and time.  Psychiatric:        Mood and Affect: Mood normal.        Behavior: Behavior normal.        Thought Content: Thought content normal.        Judgment: Judgment normal.          Assessment   No diagnosis found.    Tests ordered No orders of the defined types were placed in this encounter.    Plan: Please see assessment and plan per problem list below.   No orders of the defined types were  placed in this encounter.   Patient to follow-up in 6 weeks for blood work and then in 7 weeks for office visit.  Elenore Paddy, NP

## 2019-05-04 NOTE — Assessment & Plan Note (Signed)
Serum level was within normal limits at last blood test.  He will continue on his supplementation.

## 2019-05-04 NOTE — Assessment & Plan Note (Signed)
At home readings and second reading in the office today was acceptable.  Will not make changes to blood pressure medication at this time.  I encouraged him to check his blood pressure weekly at home and if he were to see that his blood pressure is greater than 130 over greater than 90 he should sit quietly for 5 minutes and recheck.  If he notices more than 50% of his readings are greater than 130 over greater than 90 he should call this office and we will consider increasing his amlodipine to 10 mg daily.  He tells me he understands.

## 2019-05-04 NOTE — Patient Instructions (Signed)
Thank you for choosing [facility] as your medical provider! If you have any questions or concerns regarding your health care, please do not hesitate to call our office.  Continue current medications as prescribed.  Consider restarting a low-dose aspirin (81 mg) daily.  Monitor yourself for any signs of bleeding including easy bruising, bleeding gums, blood in stool, abdominal pain.  If this were to occur stop the aspirin and call this office.  As we discussed we will start a medication for your cholesterol.  This is atorvastatin (Lipitor).  We will start a low-dose.  If you experience any new symptoms of statin intolerance as written below, please call this office.  Continue checking her blood pressure on a weekly or biweekly basis.  At your follow-up please come fasting for blood work. Statin Intolerance Statin intolerance is the inability to take a certain type of cholesterol-lowering medicine (statin) because of unwanted side effects, such as muscle pain. Statins may be prescribed to improve cholesterol levels and lower the risk for heart attack, heart disease, and stroke. People with statin intolerance may experience muscle pain and cramps (myalgia) that go away when the statin is stopped. What are the causes? The cause of this condition is not known. What increases the risk? You may be at higher risk for statin intolerance if you:  Take more than one type of cholesterol-lowering medicine at a time.  Need a higher than normal dosage of a statin.  Have a history of high CK (creatine kinase) in the blood. CK is an enzyme that is released when muscle tissue is damaged.  Are a woman.  Have a body size that is smaller than normal.  Are age 66 or older.  Drink a lot of alcohol.  Are of Asian descent.  Have kidney, liver, or muscle disease.  Have a low level of hormones that control how your body uses energy (hypothyroidism).  Take certain medicines, including: ? Certain  medicines for mental illness (antipsychotics). ? Some types of antibiotics. ? Certain medicines used for blood pressure or heart disease. ? Medicines that reduce the activity of the body's disease-fighting system (immunosuppressants). ? Medicines to treat hepatitis C and HIV/AIDS (human immunodeficiency virus/acquired immunodeficiency syndrome).  Follow an intense exercise program.  Drink a lot of grapefruit juice.  Have a lack of vitamin D (deficiency). What are the signs or symptoms? Signs and symptoms of statin intolerance include:  General muscle aches (myalgia). This may feel similar to muscle aches that are caused by the flu.  Muscle pain, tenderness, cramps, or weakness (myositis).  Severe muscle pain, weakness, and raised blood CK levels (rhabdomyolysis). Symptoms usually go away when the statin is stopped. Rarely, liver damage can also occur, which can cause:  Loss of appetite.  Pain in the upper right abdomen.  Yellowing of the skin or the white parts of the eyes (jaundice). How is this diagnosed? This condition is diagnosed based on your symptoms, your medical history, and a physical exam. You may also have blood tests. How is this treated? Your health care provider may have you stop taking the statin for a short time to see if your symptoms go away. Then your provider may restart your statin, or:  Change you to a different statin.  Lower the dosage of your statin.  Have you take your statin less often.  Change you to another type of cholesterol-lowering medicine.  Stop or change any medicines that might be interfering with your statin.  Limit how much  grapefruit juice you drink.  Recommend stopping intense exercise. Follow these instructions at home: Medicines  Take your statin medicine as told by your health care provider. Do not stop taking the statin unless your health care provider tells you to stop.  Take other over-the-counter and prescription  medicines only as told by your health care provider.  Check with your health care provider before taking any new medicines. Certain medicines can increase your risk for statin intolerance. Lifestyle  Limit alcohol intake to no more than 1 drink a day for nonpregnant women and 2 drinks a day for men. One drink equals 12 oz of beer, 5 oz of wine, or 1 oz of hard liquor.  Do not use any products that contain nicotine or tobacco, such as cigarettes and e-cigarettes. If you need help quitting, ask your health care provider. General instructions   Have blood tests to check CK levels or liver enzymes as told by your health care provider.  Exercise as directed. Ask your health care provider what exercises are best for you. Do not start a new exercise program before talking about it with your health care provider.  Follow instructions from your health care provider about eating or drinking restrictions. Your health care provider may recommend: ? Limiting the amount of grapefruit juice you drink, or not drinking it at all. ? Eating a diet that is low in saturated fats and high in fiber.  Maintain a healthy weight with diet and exercise.  Keep all follow-up visits as told by your health care provider. This is important. Contact a health care provider if:  You have any symptoms of statin intolerance. Summary  Statins are important medicines for improving your cholesterol, which may reduce your risk for heart attack and stroke.  Some people are not able to continue taking a particular statin because of muscle problems (myalgia) or other side effects.  Myalgia is the most common symptom of statin intolerance. Often, the muscle pain and cramps from myalgia go away when the statin is stopped.  Although rare, liver damage can occur as a result of statin intolerance. You should have routine blood tests to check your liver enzymes.  In most cases, statin intolerance can be managed and you can  continue to take a cholesterol-lowering medicine. This information is not intended to replace advice given to you by your health care provider. Make sure you discuss any questions you have with your health care provider. Document Released: 10/23/2016 Document Revised: 04/24/2017 Document Reviewed: 10/23/2016 Elsevier Patient Education  Patrick Nielsen.   Please follow-up as scheduled in 6 weeks. We look forward to seeing you again soon! Have a great holiday!!  At [facility] we value your feedback. You may receive a survey about your visit today. Please share your experience as we strive to create trusting relationships with our patients to provide genuine, compassionate, quality care.  We appreciate your understanding and patience as we review any laboratory studies, imaging, and other diagnostic tests that are ordered as we care for you. We do our best to address any and all results in a timely manner. If you do not hear about test results within 1 week, please do not hesitate to contact us. If we referred you to a specialist during your visit or ordered imaging testing, contact the office if you have not been contacted to be scheduled within 1 weeks.  We also encourage the use of MyChart, which contains your medical information for your review as  well. If you are not enrolled in this feature, an access code is on this after visit summary for your convenience. Thank you for allowing [place] to be involved in your care.

## 2019-05-04 NOTE — Assessment & Plan Note (Signed)
We had a long conversation regarding risks versus benefits of statin therapy.  I did use the [employer] of cardiology ASCVD risk estimator app and showed him his risk estimation as well as the impact of starting a low-dose aspirin as well as statin therapy would have on his 10-year risk score.  He tells me he would like to try statin therapy, and will restart a low-dose aspirin.  He was instructed on signs symptoms of bleeding related to aspirin, and if these were to occur to stop the aspirin.  We also discussed signs and symptoms of statin intolerance and if these were to occur he is to stop the statin and let me know.  He will return to the office in 6 weeks for blood work.  At that time he will need lipid panel as well as complete metabolic panel.  He was told to fast for this appointment.  He tells me he understands.

## 2019-05-29 ENCOUNTER — Other Ambulatory Visit (INDEPENDENT_AMBULATORY_CARE_PROVIDER_SITE_OTHER): Payer: Self-pay | Admitting: Internal Medicine

## 2019-06-16 ENCOUNTER — Other Ambulatory Visit: Payer: Self-pay

## 2019-06-16 ENCOUNTER — Other Ambulatory Visit (INDEPENDENT_AMBULATORY_CARE_PROVIDER_SITE_OTHER): Payer: Self-pay | Admitting: Nurse Practitioner

## 2019-06-16 ENCOUNTER — Encounter (INDEPENDENT_AMBULATORY_CARE_PROVIDER_SITE_OTHER): Payer: Medicare HMO

## 2019-06-16 DIAGNOSIS — E782 Mixed hyperlipidemia: Secondary | ICD-10-CM

## 2019-06-16 NOTE — Progress Notes (Unsigned)
This encounter was created in error - please disregard.  This encounter was created in error - please disregard.

## 2019-06-17 LAB — COMPLETE METABOLIC PANEL WITH GFR
AG Ratio: 1.4 (calc) (ref 1.0–2.5)
ALT: 30 U/L (ref 9–46)
AST: 25 U/L (ref 10–35)
Albumin: 4.2 g/dL (ref 3.6–5.1)
Alkaline phosphatase (APISO): 55 U/L (ref 35–144)
BUN: 11 mg/dL (ref 7–25)
CO2: 27 mmol/L (ref 20–32)
Calcium: 9.8 mg/dL (ref 8.6–10.3)
Chloride: 101 mmol/L (ref 98–110)
Creat: 0.8 mg/dL (ref 0.70–1.25)
GFR, Est African American: 108 mL/min/{1.73_m2} (ref >=60)
GFR, Est Non African American: 93 mL/min/{1.73_m2} (ref >=60)
Globulin: 3 g/dL (calc) (ref 1.9–3.7)
Glucose, Bld: 113 mg/dL — ABNORMAL HIGH (ref 65–99)
Potassium: 4.1 mmol/L (ref 3.5–5.3)
Sodium: 137 mmol/L (ref 135–146)
Total Bilirubin: 0.6 mg/dL (ref 0.2–1.2)
Total Protein: 7.2 g/dL (ref 6.1–8.1)

## 2019-06-17 LAB — LIPID PANEL
Cholesterol: 228 mg/dL — ABNORMAL HIGH (ref <200)
HDL: 49 mg/dL (ref >=40)
LDL Cholesterol (Calc): 144 mg/dL (calc) — ABNORMAL HIGH
Non-HDL Cholesterol (Calc): 179 mg/dL (calc) — ABNORMAL HIGH (ref <130)
Total CHOL/HDL Ratio: 4.7 (calc) (ref <5.0)
Triglycerides: 208 mg/dL — ABNORMAL HIGH (ref <150)

## 2019-06-17 NOTE — Progress Notes (Signed)
Order(s) created erroneously. Erroneous order ID: [id number]  Order moved by: Shaylan Tutton Y  Order move date/time: 06/17/2019 11:22 AM  Source Patient: [id number]  Source Contact: 06/16/2019  Destination Patient: [id number]  Destination Contact: 06/16/2019 

## 2019-06-17 NOTE — Progress Notes (Signed)
Order(s) created erroneously. Erroneous order ID: [id number]  Order moved by: Cheryle Horsfall  Order move date/time: 06/17/2019 11:24 AM  Source Patient: [id number]  Source Contact: 06/16/2019  Destination Patient: [id number]  Destination Contact: 06/16/2019

## 2019-06-17 NOTE — Progress Notes (Signed)
Order(s) created erroneously. Erroneous order ID: [id number]  Order moved by: Jenae Tomasello Y  Order move date/time: 06/17/2019 11:25 AM  Source Patient: [id number]  Source Contact: 06/16/2019  Destination Patient: [id number]  Destination Contact: 06/16/2019 

## 2019-06-17 NOTE — Progress Notes (Signed)
Order(s) created erroneously. Erroneous order ID: [id number]  Order moved by: Cheryle Horsfall  Order move date/time: 06/17/2019 11:25 AM  Source Patient: [id number]  Source Contact: 06/16/2019  Destination Patient: [id number]  Destination Contact: 06/16/2019

## 2019-06-17 NOTE — Progress Notes (Signed)
Order(s) created erroneously. Erroneous order ID: [id number]  Order moved by: Cheryle Horsfall  Order move date/time: 06/17/2019 11:22 AM  Source Patient: [id number]  Source Contact: 06/16/2019  Destination Patient: [id number]  Destination Contact: 06/16/2019

## 2019-06-17 NOTE — Progress Notes (Signed)
Order(s) created erroneously. Erroneous order ID: [id number]  Order moved by: Edwardine Deschepper Y  Order move date/time: 06/17/2019 11:23 AM  Source Patient: [id number]  Source Contact: 06/16/2019  Destination Patient: [id number]  Destination Contact: 06/16/2019 

## 2019-06-17 NOTE — Progress Notes (Signed)
Order(s) created erroneously. Erroneous order ID: [id number]  Order moved by: Lulia Schriner Y  Order move date/time: 06/17/2019 11:24 AM  Source Patient: [id number]  Source Contact: 06/16/2019  Destination Patient: [id number]  Destination Contact: 06/16/2019 

## 2019-06-17 NOTE — Progress Notes (Signed)
Order(s) created erroneously. Erroneous order ID: [id number]  Order moved by: Cheryle Horsfall  Order move date/time: 06/17/2019 11:23 AM  Source Patient: [id number]  Source Contact: 06/16/2019  Destination Patient: [id number]  Destination Contact: 06/16/2019

## 2019-06-25 ENCOUNTER — Other Ambulatory Visit (INDEPENDENT_AMBULATORY_CARE_PROVIDER_SITE_OTHER): Payer: Self-pay | Admitting: Internal Medicine

## 2019-06-29 ENCOUNTER — Ambulatory Visit (INDEPENDENT_AMBULATORY_CARE_PROVIDER_SITE_OTHER): Payer: Medicare HMO | Admitting: Nurse Practitioner

## 2019-07-06 ENCOUNTER — Other Ambulatory Visit (INDEPENDENT_AMBULATORY_CARE_PROVIDER_SITE_OTHER): Payer: Self-pay | Admitting: Internal Medicine

## 2019-07-06 ENCOUNTER — Telehealth (INDEPENDENT_AMBULATORY_CARE_PROVIDER_SITE_OTHER): Payer: Self-pay | Admitting: Internal Medicine

## 2019-07-06 MED ORDER — AMLODIPINE BESYLATE 5 MG PO TABS: 5.0000 mg | ORAL_TABLET | Freq: Every day | ORAL | 0 refills | Status: DC

## 2019-07-06 MED ORDER — LOSARTAN POTASSIUM-HCTZ 100-25 MG PO TABS: 1.0000 | ORAL_TABLET | Freq: Every day | ORAL | 0 refills | Status: DC

## 2019-07-07 NOTE — Telephone Encounter (Signed)
Done

## 2019-08-10 ENCOUNTER — Ambulatory Visit (INDEPENDENT_AMBULATORY_CARE_PROVIDER_SITE_OTHER): Payer: Medicare HMO | Admitting: Nurse Practitioner

## 2019-09-05 ENCOUNTER — Other Ambulatory Visit: Payer: Self-pay

## 2019-09-05 ENCOUNTER — Ambulatory Visit (INDEPENDENT_AMBULATORY_CARE_PROVIDER_SITE_OTHER): Payer: Medicare HMO | Admitting: Nurse Practitioner

## 2019-09-05 ENCOUNTER — Encounter (INDEPENDENT_AMBULATORY_CARE_PROVIDER_SITE_OTHER): Payer: Self-pay | Admitting: Nurse Practitioner

## 2019-09-05 VITALS — BP 140/65 | Temp 98.4°F | Ht 66.0 in | Wt 194.6 lb

## 2019-09-05 DIAGNOSIS — Z23 Encounter for immunization: Secondary | ICD-10-CM | POA: Diagnosis not present

## 2019-09-05 DIAGNOSIS — Z0000 Encounter for general adult medical examination without abnormal findings: Secondary | ICD-10-CM

## 2019-09-05 NOTE — Progress Notes (Signed)
Subjective:   Patrick Nielsen is a 68 y.o. male who presents for Medicare Annual/Subsequent preventive examination.  Review of Systems:  Reviewed and negative Cardiac Risk Factors include: none     Objective:    Vitals: BP 140/65 (BP Location: Left Arm, Patient Position: Sitting, Cuff Size: Normal)   Temp 98.4 F (36.9 C) (Oral)   Ht 5\' 6"  (1.676 m)   Wt 194 lb 9.6 oz (88.3 kg)   BMI 31.41 kg/m   Body mass index is 31.41 kg/m.  Advanced Directives 09/05/2019 04/10/2018 07/02/2017 04/23/2017 04/18/2017 02/26/2017 02/17/2016  Does Patient Have a Medical Advance Directive? No No No No No No No  Would patient like information on creating a medical advance directive? Yes (ED - Information included in AVS) No - Patient declined - No - Patient declined - No - Patient declined No - patient declined information    Tobacco Social History   Tobacco Use  Smoking Status Former Smoker  . Packs/day: 2.00  . Years: 28.00  . Pack years: 56.00  . Types: Cigarettes  . Quit date: 05/26/1996  . Years since quitting: 23.2  Smokeless Tobacco Never Used     Counseling given: Not Answered   Clinical Intake:     Pain : No/denies pain     Nutritional Status: BMI > 30  Obese Nutritional Risks: None Diabetes: No  How often do you need to have someone help you when you read instructions, pamphlets, or other written materials from your doctor or pharmacy?: 1 - Never What is the last grade level you completed in school?: 6th grade  Interpreter Needed?: No  Information entered by :: [id number], CMA  Past Medical History:  Diagnosis Date  . Essential hypertension, benign 03/02/2019  . HLD (hyperlipidemia) 03/02/2019  . Vitamin D deficiency disease 03/02/2019   No past surgical history on file. Family History  Problem Relation Age of Onset  . Emphysema Mother   . Alcohol abuse Father   . Emphysema Brother    Social History   Socioeconomic History  . Marital status: Married   Spouse name: Not on file  . Number of children: Not on file  . Years of education: Not on file  . Highest education level: Not on file  Occupational History  . Not on file  Tobacco Use  . Smoking status: Former Smoker    Packs/day: 2.00    Years: 28.00    Pack years: 56.00    Types: Cigarettes    Quit date: 05/26/1996    Years since quitting: 23.2  . Smokeless tobacco: Never Used  Substance and Sexual Activity  . Alcohol use: Yes    Alcohol/week: 12.0 standard drinks    Types: 12 Cans of beer per week    Comment: only on weekends  . Drug use: No  . Sexual activity: Not on file  Other Topics Concern  . Not on file  Social History Narrative   Married for 30 years.07/24/1996, will continue working and not retire.   Social Determinants of Health   Financial Resource Strain:   . Difficulty of Paying Living Expenses:   Food Insecurity:   . Worried About Patrick Nielsen in the Last Nielsen:   . Patrick Nielsen:   Transportation Needs:   . Patrick (Medical):   Freight forwarder Lack of Transportation (Non-Medical):   Physical Activity:   . Days of Exercise per Week:   . Minutes of Exercise per  Session:   Stress:   . Feeling of Stress :   Social Connections:   . Frequency of Communication with Friends and Family:   . Frequency of Social Gatherings with Friends and Family:   . Attends Religious Services:   . Active Member of Clubs or Organizations:   . Attends Banker Meetings:   Patrick Nielsen Kitchen Marital Status:     Outpatient Encounter Medications as of 09/05/2019  Medication Sig  . amLODipine (NORVASC) 5 MG tablet Take 1 tablet (5 mg total) by mouth daily.  . Cholecalciferol (VITAMIN D3) 125 MCG (5000 UT) TABS Take 1 tablet by mouth daily.  . fluticasone (FLONASE) 50 MCG/ACT nasal spray SPRAY 1 SPRAY INTO EACH NOSTRIL EVERY DAY  . losartan-hydrochlorothiazide (HYZAAR) 100-25 MG tablet Take 1 tablet by mouth daily.  . Omega-3 Fatty Acids (FISH OIL PO) Take  1 capsule by mouth daily.  . [DISCONTINUED] atorvastatin (LIPITOR) 10 MG tablet Take 1 tablet (10 mg total) by mouth every evening. (Patient not taking: Reported on 09/05/2019)  . [DISCONTINUED] losartan-hydrochlorothiazide (HYZAAR) 100-25 MG tablet Take 1 tablet by mouth daily.   No facility-administered encounter medications on file as of 09/05/2019.    Activities of Daily Living In your present state of health, do you have any difficulty performing the following activities: 09/05/2019  Hearing? Y  Vision? Y  Comment wears glasses  Difficulty concentrating or making decisions? N  Walking or climbing stairs? N  Dressing or bathing? N  Doing errands, shopping? N  Preparing Food and eating ? N  Using the Toilet? N  In the past six months, have you accidently leaked urine? N  Do you have problems with loss of bowel control? N  Managing your Medications? N  Managing your Finances? N  Housekeeping or managing your Housekeeping? N  Some recent data might be hidden    Patient Care Team: Wilson Singer, MD as PCP - General (Internal Medicine)   Assessment:   This is a routine wellness examination for Patrick Nielsen.  Exercise Activities and Dietary recommendations Current Exercise Habits: The patient does not participate in regular exercise at present, Exercise limited by: None identified  Goals   None     Fall Risk Fall Risk  09/05/2019 03/02/2019  Falls in the past Nielsen? 0 0  Number falls in past yr: 0 -  Injury with Fall? 0 -  Risk for fall due to : No Fall Risks -  Follow up Falls evaluation completed -   Is the patient's home free of loose throw rugs in walkways, pet beds, electrical cords, etc?   yes      Grab bars in the bathroom? no      Handrails on the stairs?   yes      Adequate lighting?   yes  Timed Get Up and Go Performed: 8 seconds  Depression Screen PHQ 2/9 Scores 09/05/2019  PHQ - 2 Score 0  Exception Documentation Medical reason    Cognitive Function       6CIT Screen 09/05/2019  What Nielsen? 0 points  What month? 0 points  What time? 0 points  Count back from 20 0 points  Months in reverse 4 points  Repeat phrase 10 points  Total Score 14    Immunization History  Administered Date(s) Administered  . Influenza-Unspecified 03/02/2019  . Pneumococcal Conjugate-13 08/05/2018  . Tdap 03/02/2019    Qualifies for Shingles Vaccine? Completed  Screening Tests Health Maintenance  Topic Date Due  . PNA  vac Low Risk Adult (2 of 2 - PPSV23) 08/05/2019  . COLONOSCOPY  03/01/2020 (Originally 12/18/2002)  . INFLUENZA VACCINE  12/25/2019  . TETANUS/TDAP  03/01/2029  . Hepatitis C Screening  Completed   Cancer Screenings: Lung: Low Dose CT Chest recommended if Age 81-80 years, 30 pack-Nielsen currently smoking OR have quit w/in 15years. Patient does not qualify. Colorectal: Refused today  Additional Screenings: Hepatitis C Screening: Completed previously      Plan:   Patient is due for PPSV23 vaccination today, this will be administered.  He believes he had a shingles vaccine in the past but not exactly sure when.  He is not interested in the COVID-19 vaccine, however I told him if he changes his mind that he should wear at least 2 weeks from today in order to space out the pneumonia vaccine and Covid shots.  Tells me he understands.  Otherwise he is up-to-date with vaccinations.  We discussed colon cancer screening.  He is not willing to undergo colonoscopy at this time regardless of if they fecal study is done and abnormalities are noted.  For this reason we will not complete colon cancer screening right now.  He has declined to have sexual transmitted infection screen repeated.  He believes he has had abdominal aortic aneurysm screening in the past and this was negative.  I do not see any evidence of this, however will not repeat today.  Depression screen completed and negative.  Fall screening completed and he is low fall risk.  Hepatitis C  screening completed and negative in the past.  He does not qualify for lung cancer screening at this time.  Prostate cancer screening via PSA was completed last in August 2020 at that point his PSA level was 2.3.  May need to consider requesting PSA at next office visit.  I have personally reviewed and noted the following in the patient's chart:   . Medical and social history . Use of alcohol, tobacco or illicit drugs  . Current medications and supplements . Functional ability and status . Nutritional status . Physical activity . Advanced directives . List of other physicians . Hospitalizations, surgeries, and ER visits in previous 12 months . Vitals . Screenings to include cognitive, depression, and falls . Referrals and appointments  In addition, I have reviewed and discussed with patient certain preventive protocols, quality metrics, and best practice recommendations. A written personalized care plan for preventive services as well as general preventive health recommendations were provided to patient.   He will follow-up in approximately 3 months or sooner as needed.  Rodney Langton, [place]  09/05/2019

## 2019-09-05 NOTE — Patient Instructions (Signed)
  Mr. Parkerson , Thank you for taking time to come for your Medicare Wellness Visit. I appreciate your ongoing commitment to your health goals. Please review the following plan we discussed and let me know if I can assist you in the future.   These are the goals we discussed: Goals   None     This is a list of the screening recommended for you and due dates:  Health Maintenance  Topic Date Due  . Pneumonia vaccines (2 of 2 - PPSV23) 08/05/2019  . Colon Cancer Screening  03/01/2020*  . Flu Shot  12/25/2019  . Tetanus Vaccine  03/01/2029  .  Hepatitis C: One time screening is recommended by Center for Disease Control  (CDC) for  adults born from [birth year] through 1963.   Completed  *Topic was postponed. The date shown is not the original due date.

## 2019-10-11 ENCOUNTER — Other Ambulatory Visit (INDEPENDENT_AMBULATORY_CARE_PROVIDER_SITE_OTHER): Payer: Self-pay | Admitting: Nurse Practitioner

## 2019-10-11 DIAGNOSIS — E782 Mixed hyperlipidemia: Secondary | ICD-10-CM

## 2019-10-13 ENCOUNTER — Telehealth (INDEPENDENT_AMBULATORY_CARE_PROVIDER_SITE_OTHER): Payer: Self-pay | Admitting: Internal Medicine

## 2019-10-13 ENCOUNTER — Other Ambulatory Visit (INDEPENDENT_AMBULATORY_CARE_PROVIDER_SITE_OTHER): Payer: Self-pay | Admitting: Internal Medicine

## 2019-10-13 MED ORDER — LOSARTAN POTASSIUM-HCTZ 100-25 MG PO TABS: 1.0000 | ORAL_TABLET | Freq: Every day | ORAL | 0 refills | Status: DC

## 2019-10-13 MED ORDER — AMLODIPINE BESYLATE 5 MG PO TABS: 5.0000 mg | ORAL_TABLET | Freq: Every day | ORAL | 0 refills | Status: DC

## 2019-10-13 NOTE — Telephone Encounter (Signed)
Done

## 2019-12-13 ENCOUNTER — Other Ambulatory Visit: Payer: Self-pay

## 2019-12-13 ENCOUNTER — Ambulatory Visit (INDEPENDENT_AMBULATORY_CARE_PROVIDER_SITE_OTHER): Payer: Medicare HMO | Admitting: Nurse Practitioner

## 2019-12-13 ENCOUNTER — Encounter: Payer: Self-pay | Admitting: Internal Medicine

## 2019-12-13 VITALS — BP 140/79 | HR 84 | Temp 97.8°F | Resp 14 | Ht 66.0 in | Wt 201.0 lb

## 2019-12-13 DIAGNOSIS — J302 Other seasonal allergic rhinitis: Secondary | ICD-10-CM | POA: Diagnosis not present

## 2019-12-13 DIAGNOSIS — J309 Allergic rhinitis, unspecified: Secondary | ICD-10-CM | POA: Insufficient documentation

## 2019-12-13 DIAGNOSIS — I10 Essential (primary) hypertension: Secondary | ICD-10-CM

## 2019-12-13 DIAGNOSIS — E559 Vitamin D deficiency, unspecified: Secondary | ICD-10-CM

## 2019-12-13 DIAGNOSIS — E782 Mixed hyperlipidemia: Secondary | ICD-10-CM | POA: Diagnosis not present

## 2019-12-13 MED ORDER — FLUTICASONE PROPIONATE 50 MCG/ACT NA SUSP: 1.0000 | Freq: Every day | NASAL | 1 refills | Status: DC

## 2019-12-13 NOTE — Progress Notes (Signed)
Subjective:  Patient ID: Patrick Nielsen, male    DOB: Mar 25, 1951  Age: 69 y.o. MRN: [id number]  CC:  Chief Complaint  Patient presents with  . Hypertension  . Other    Vitamin D deficiency, allergic rhinitis  . Hyperlipidemia      HPI  This patient arrives today for the above.  He has history of elevated blood pressure continues on his antihypertensives without issue. He also has a history of vitamin D deficiency and continues on a vitamin D3 supplement. He does have hyperlipidemia and continues on omega-3 fatty acid. I do see per the medical record he was on atorvastatin at some point, but today it does not bring that with him as part of his medications. He does not seem to recall being on this medication. He does not remember whether or not it was started and stopped due to adverse side effects. Last lipid panel was collected earlier this year and LDL was 144. Per chart review I do see that I discussed statin therapy with him back in December 2020 and he agreed to start atorvastatin 10 mg daily at that time, his LDL referenced above was checked 6 weeks after he supposedly started taking the atorvastatin, however as previously stated he does not have this medication with him today and he does not endorse taking the medication currently. He also was told to start a low-dose aspirin, but tells me he is not doing that either. He does have seasonal allergic rhinitis, he is no longer using Flonase nasal sprays because his medication refills ran out. Generally he feels that his congestion is not bothersome at this time.   Past Medical History:  Diagnosis Date  . Essential hypertension, benign 03/02/2019  . HLD (hyperlipidemia) 03/02/2019  . Vitamin D deficiency disease 03/02/2019      Family History  Problem Relation Age of Onset  . Emphysema Mother   . Alcohol abuse Father   . 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   Social History   Tobacco Use  . Smoking status: Former Smoker    Packs/day: 2.00    Years: 28.00    Pack years: 56.00    Types: Cigarettes    Quit date: 05/26/1996    Years since quitting: 23.5  . Smokeless tobacco: Never Used  Substance Use Topics  . Alcohol use: Yes    Alcohol/week: 12.0 standard drinks    Types: 12 Cans of beer per week    Comment: only on weekends     No outpatient medications have been marked as taking for the 12/13/19 encounter (Office Visit) with Elenore Paddy, NP.    ROS:  Review of Systems  Constitutional: Negative for fever, malaise/fatigue and weight loss.  Eyes: Negative for blurred vision and double vision.  Respiratory: Negative for cough, shortness of breath and wheezing.   Cardiovascular: Negative for chest pain and palpitations.  Neurological: Negative for dizziness and headaches.     Objective:   Today's Vitals: BP 140/79 (BP Location: Left Arm, Patient Position: Sitting, Cuff Size: Normal)   Pulse 84   Temp 97.8 F (36.6 C) (Temporal)   Resp 14   Ht 5\' 6"  (1.676 m)   Wt 201 lb (91.2 kg)   SpO2 96%   BMI 32.44 kg/m  Vitals with BMI 12/13/2019 12/13/2019 09/05/2019  Height 5\' 6"  5\' 6"  5\' 6"   Weight 201 lbs 201 lbs 194 lbs 10 oz  BMI 32.46 32.46 31.42  Systolic 140 140 315  Diastolic 79 79 65  Pulse 84 80 -     Physical Exam Vitals reviewed.  Constitutional:      Appearance: Normal appearance.  HENT:     Head: Normocephalic and atraumatic.  Cardiovascular:     Rate and Rhythm: Normal rate and regular rhythm.  Pulmonary:     Effort: Pulmonary effort is normal.     Breath sounds: Normal breath sounds.  Musculoskeletal:     Cervical back: Neck supple.  Skin:    General: Skin is warm and dry.  Neurological:     Mental Status: He is alert and oriented to person, place, and time.  Psychiatric:        Mood and Affect: Mood normal.        Behavior: Behavior normal.        Thought Content:  Thought content normal.        Judgment: Judgment normal.          Assessment and Plan   1. Mixed hyperlipidemia   2. Seasonal allergic rhinitis, unspecified trigger   3. Essential hypertension, benign   4. Vitamin D deficiency disease      Plan: 1. I again discussed his lipid panel as well as treatment options including statin therapy and aspirin in addition to his omega-3 supplement. Per shared decision making he is decided to hold off on reinitiating statin therapy or aspirin at this time. I did express to him that he needs to focus on healthy lifestyle especially increase his intake of whole foods and reduce or control his portions of meat-based proteins. He tells me he understands.  2. I will send a refill of his Flonase nasal spray so that he can use this as needed.  3. He'll continue on his current prescription medications to control his hypertension.  4. He'll continue on his current vitamin D3 supplement dose.   Tests ordered No orders of the defined types were placed in this encounter.     Meds ordered this encounter  Medications  . fluticasone (FLONASE) 50 MCG/ACT nasal spray    Sig: Place 1 spray into both nostrils daily.    Dispense:  48 mL    Refill:  1    Order Specific Question:   Supervising Provider    Answer:   Wilson Singer [[id number]]    Patient to follow-up in 6 weeks for his annual physical exam at which time blood work will be recollected.  Elenore Paddy, NP

## 2020-01-05 ENCOUNTER — Other Ambulatory Visit (INDEPENDENT_AMBULATORY_CARE_PROVIDER_SITE_OTHER): Payer: Self-pay | Admitting: Internal Medicine

## 2020-01-25 ENCOUNTER — Other Ambulatory Visit: Payer: Self-pay

## 2020-01-25 ENCOUNTER — Encounter (INDEPENDENT_AMBULATORY_CARE_PROVIDER_SITE_OTHER): Payer: Self-pay | Admitting: Nurse Practitioner

## 2020-01-25 ENCOUNTER — Ambulatory Visit (INDEPENDENT_AMBULATORY_CARE_PROVIDER_SITE_OTHER): Payer: Medicare HMO | Admitting: Nurse Practitioner

## 2020-01-25 VITALS — BP 150/75 | HR 84 | Temp 98.1°F | Ht 66.0 in | Wt 202.6 lb

## 2020-01-25 DIAGNOSIS — E782 Mixed hyperlipidemia: Secondary | ICD-10-CM | POA: Diagnosis not present

## 2020-01-25 DIAGNOSIS — I10 Essential (primary) hypertension: Secondary | ICD-10-CM

## 2020-01-25 DIAGNOSIS — E559 Vitamin D deficiency, unspecified: Secondary | ICD-10-CM | POA: Diagnosis not present

## 2020-01-25 DIAGNOSIS — Z131 Encounter for screening for diabetes mellitus: Secondary | ICD-10-CM

## 2020-01-25 DIAGNOSIS — Z0001 Encounter for general adult medical examination with abnormal findings: Secondary | ICD-10-CM

## 2020-01-25 DIAGNOSIS — Z1329 Encounter for screening for other suspected endocrine disorder: Secondary | ICD-10-CM | POA: Diagnosis not present

## 2020-01-25 NOTE — Progress Notes (Signed)
Subjective:  Patient ID: Patrick Nielsen, male    DOB: 01-06-1951  Age: 69 y.o. MRN: [id number]  CC:  Chief Complaint  Patient presents with  . Annual Exam      HPI  This patient arrives today for his annual physical exam.  As far as his health maintenance goes he tells me he has had a shingles vaccine he is not interested in the Covid vaccinations at this time.  He would like a flu shot this season.  He is due for colon cancer screening but he would like to hold off on this for now.  He is not interested in sexually transmitted infection screening.  He was a smoker but quit greater than 15 years ago.  He does not want to abdominal aortic aneurysm screening.  Depression screening is due today fall screening is due today.  We have already screened for hepatitis C in the past.  He is not interested in doing prostate cancer via PSA level today.  He has no acute complaints today.  Past Medical History:  Diagnosis Date  . Essential hypertension, benign 03/02/2019  . HLD (hyperlipidemia) 03/02/2019  . Vitamin D deficiency disease 03/02/2019      Family History  Problem Relation Age of Onset  . Emphysema Mother   . Alcohol abuse Father   . 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   Social History   Tobacco Use  . Smoking status: Former Smoker    Packs/day: 2.00    Years: 28.00    Pack years: 56.00    Types: Cigarettes    Quit date: 05/26/1996    Years since quitting: 23.6  . Smokeless tobacco: Never Used  Substance Use Topics  . Alcohol use: Yes    Alcohol/week: 12.0 standard drinks    Types: 12 Cans of beer per week    Comment: only on weekends     Current Meds  Medication Sig  . amLODipine (NORVASC) 5 MG tablet TAKE 1 TABLET BY MOUTH EVERY DAY  . Cholecalciferol (VITAMIN D3) 125 MCG (5000 UT) TABS Take 1 tablet by mouth daily.  . fluticasone (FLONASE) 50 MCG/ACT nasal  spray Place 1 spray into both nostrils daily.  Marland Kitchen losartan-hydrochlorothiazide (HYZAAR) 100-25 MG tablet Take 1 tablet by mouth daily.  . Omega-3 Fatty Acids (FISH OIL PO) Take 1 capsule by mouth daily.    ROS:  Review of Systems  Constitutional: Negative for malaise/fatigue and weight loss.  Eyes: Negative for blurred vision and double vision.  Respiratory: Negative for cough and shortness of breath.   Cardiovascular: Negative for chest pain.  Gastrointestinal: Negative for abdominal pain and blood in stool.  Neurological: Negative for dizziness and headaches.  Psychiatric/Behavioral: Negative for depression and suicidal ideas.     Objective:   Today's Vitals: BP (!) 150/75 (BP Location: Left Arm, Patient Position: Sitting, Cuff Size: Normal)   Pulse 84   Temp 98.1 F (36.7 C) (Temporal)   Ht '5\' 6"'  (1.676 m)   Wt 202 lb 9.6 oz (91.9 kg)   SpO2 95%   BMI 32.70 kg/m  Vitals with BMI 01/25/2020 12/13/2019 12/13/2019  Height '5\' 6"'  '5\' 6"'  '5\' 6"'   Weight 202 lbs 10 oz 201 lbs 201 lbs  BMI 32.72 51.89 84.21  Systolic 031 281 188  Diastolic 75 79 79  Pulse 84 84 80     PHQ9  SCORE ONLY 01/25/2020 09/05/2019  PHQ-9 Total Score 0 0   Fall Risk  01/25/2020 09/05/2019 03/02/2019  Falls in the past year? 0 0 0  Number falls in past yr: 0 0 -  Injury with Fall? 0 0 -  Risk for fall due to : No Fall Risks No Fall Risks -  Follow up Falls evaluation completed Falls evaluation completed -     Physical Exam Vitals reviewed.  Constitutional:      General: He is not in acute distress.    Appearance: Normal appearance. He is not ill-appearing.  HENT:     Head: Normocephalic and atraumatic.     Right Ear: Tympanic membrane, ear canal and external ear normal.     Left Ear: Tympanic membrane, ear canal and external ear normal.  Eyes:     General: No scleral icterus.    Extraocular Movements: Extraocular movements intact.     Conjunctiva/sclera: Conjunctivae normal.     Pupils: Pupils are  equal, round, and reactive to light.  Neck:     Vascular: No carotid bruit.  Cardiovascular:     Rate and Rhythm: Normal rate and regular rhythm.     Pulses: Normal pulses.     Heart sounds: Normal heart sounds.  Pulmonary:     Effort: Pulmonary effort is normal.     Breath sounds: Normal breath sounds.  Abdominal:     General: Abdomen is flat. Bowel sounds are normal. There is no distension.     Palpations: Abdomen is soft. There is no hepatomegaly, splenomegaly or mass.     Tenderness: There is no abdominal tenderness.     Hernia: A hernia is present.    Musculoskeletal:        General: No swelling or tenderness.     Cervical back: Normal range of motion and neck supple. No rigidity.  Lymphadenopathy:     Cervical: No cervical adenopathy.  Skin:    General: Skin is warm and dry.  Neurological:     General: No focal deficit present.     Mental Status: He is alert and oriented to person, place, and time.     Cranial Nerves: No cranial nerve deficit.     Sensory: No sensory deficit.     Motor: No weakness.     Gait: Gait normal.  Psychiatric:        Mood and Affect: Mood normal.        Behavior: Behavior normal.        Thought Content: Thought content normal.        Judgment: Judgment normal.          Assessment and Plan   1. Encounter for general adult medical examination with abnormal findings   2. Vitamin D deficiency disease   3. Mixed hyperlipidemia   4. Essential hypertension, benign   5. Screening for thyroid disorder   6. Screening for diabetes mellitus      Plan: 1.  We will administer flu shot next month once, and will make patient appointment for this today.  He is still not interested in COVID-19 vaccinations.  We will not refer him for colonoscopy due to his request, will not screen for sexual transmitted infection per his request.  He does not require tobacco cessation conversation.  Will not send him for aortic abdominal aneurysm screening per his  request, but he was encouraged that me know to change his mind.  Depression screening and fall screenings were negative and/or low fall  risk today.  Hepatitis C screening has been completed in the past.  Will not send for lung cancer screening due to the fact that he quit smoking greater than 15 years ago.  We will not screen for prostate cancer via PSA per his request. 2.-6.  We will collect blood work today for further evaluation of his chronic conditions as well as to screen for diabetes and hypothyroidism.  Blood pressure was above goal today, it has been better in previous visits.  He does have an at home blood pressure cuff, he is agreeable to checking his blood pressure every day for the next week and let me know if his blood pressure remains elevated at home.  I have given him written instructions to follow today as well.   Tests ordered Orders Placed This Encounter  Procedures  . CBC  . CMP with eGFR([facility])  . Lipid Panel  . Hemoglobin A1c  . TSH  . Vitamin D, 25-hydroxy      No orders of the defined types were placed in this encounter.   Patient to follow-up in 1 month for nurses visit to have flu shot administered, 3 months for office visit with Dr. Anastasio Champion, in 1 year for annual physical exam.  In addition to performing his annual physical exam today I did perform an office visit to address his elevated blood pressure as well as some of his chronic conditions as listed above.  Ailene Ards, NP

## 2020-01-25 NOTE — Patient Instructions (Addendum)
Check your blood pressure daily for 7 days and keep a written log of this. If you see the top number 150 or higher OR the bottom number 100 or higher on two or more occasions, call this office prior to your next appointment.   If you experience any chest pain, significant fatigue, shortness of breath, visual changes, difficulty speaking, difficulty swallowing, weakness or numbness in arms or legs, difficulty moving your facial muscles, etc. that you need to call 911.

## 2020-01-26 ENCOUNTER — Encounter (INDEPENDENT_AMBULATORY_CARE_PROVIDER_SITE_OTHER): Payer: Self-pay | Admitting: Nurse Practitioner

## 2020-01-26 LAB — COMPLETE METABOLIC PANEL WITH GFR
AG Ratio: 1.5 (calc) (ref 1.0–2.5)
ALT: 33 U/L (ref 9–46)
AST: 24 U/L (ref 10–35)
Albumin: 4.5 g/dL (ref 3.6–5.1)
Alkaline phosphatase (APISO): 63 U/L (ref 35–144)
BUN: 17 mg/dL (ref 7–25)
CO2: 27 mmol/L (ref 20–32)
Calcium: 9.6 mg/dL (ref 8.6–10.3)
Chloride: 99 mmol/L (ref 98–110)
Creat: 0.89 mg/dL (ref 0.70–1.25)
GFR, Est African American: 103 mL/min/{1.73_m2} (ref >=60)
GFR, Est Non African American: 88 mL/min/{1.73_m2} (ref >=60)
Globulin: 3 g/dL (calc) (ref 1.9–3.7)
Glucose, Bld: 112 mg/dL — ABNORMAL HIGH (ref 65–99)
Potassium: 4.4 mmol/L (ref 3.5–5.3)
Sodium: 137 mmol/L (ref 135–146)
Total Bilirubin: 0.8 mg/dL (ref 0.2–1.2)
Total Protein: 7.5 g/dL (ref 6.1–8.1)

## 2020-01-26 LAB — CBC
HCT: 42.8 % (ref 38.5–50.0)
Hemoglobin: 14 g/dL (ref 13.2–17.1)
MCH: 30.4 pg (ref 27.0–33.0)
MCHC: 32.7 g/dL (ref 32.0–36.0)
MCV: 92.8 fL (ref 80.0–100.0)
MPV: 10.6 fL (ref 7.5–12.5)
Platelets: 178 10*3/uL (ref 140–400)
RBC: 4.61 10*6/uL (ref 4.20–5.80)
RDW: 12.8 % (ref 11.0–15.0)
WBC: 5.5 10*3/uL (ref 3.8–10.8)

## 2020-01-26 LAB — LIPID PANEL
Cholesterol: 245 mg/dL — ABNORMAL HIGH (ref <200)
HDL: 49 mg/dL (ref >=40)
LDL Cholesterol (Calc): 151 mg/dL (calc) — ABNORMAL HIGH
Non-HDL Cholesterol (Calc): 196 mg/dL (calc) — ABNORMAL HIGH (ref <130)
Total CHOL/HDL Ratio: 5 (calc) — ABNORMAL HIGH (ref <5.0)
Triglycerides: 275 mg/dL — ABNORMAL HIGH (ref <150)

## 2020-01-26 LAB — HEMOGLOBIN A1C
Hgb A1c MFr Bld: 5.6 % of total Hgb (ref <5.7)
Mean Plasma Glucose: 114 (calc)
eAG (mmol/L): 6.3 (calc)

## 2020-01-26 LAB — VITAMIN D 25 HYDROXY (VIT D DEFICIENCY, FRACTURES): Vit D, 25-Hydroxy: 60 ng/mL (ref 30–100)

## 2020-01-26 LAB — TSH: TSH: 2.04 mIU/L (ref 0.40–4.50)

## 2020-02-27 ENCOUNTER — Other Ambulatory Visit: Payer: Self-pay

## 2020-02-27 ENCOUNTER — Ambulatory Visit (INDEPENDENT_AMBULATORY_CARE_PROVIDER_SITE_OTHER): Payer: Medicare HMO

## 2020-02-27 DIAGNOSIS — Z23 Encounter for immunization: Secondary | ICD-10-CM

## 2020-02-27 NOTE — Progress Notes (Signed)
Pt given High dose flu vaccine to left arm., Pt tolerated well. No complaints.

## 2020-04-08 ENCOUNTER — Other Ambulatory Visit (INDEPENDENT_AMBULATORY_CARE_PROVIDER_SITE_OTHER): Payer: Self-pay | Admitting: Internal Medicine

## 2020-04-10 ENCOUNTER — Telehealth (INDEPENDENT_AMBULATORY_CARE_PROVIDER_SITE_OTHER): Payer: Self-pay | Admitting: Internal Medicine

## 2020-04-10 ENCOUNTER — Other Ambulatory Visit (INDEPENDENT_AMBULATORY_CARE_PROVIDER_SITE_OTHER): Payer: Self-pay | Admitting: Internal Medicine

## 2020-04-10 DIAGNOSIS — E782 Mixed hyperlipidemia: Secondary | ICD-10-CM

## 2020-04-10 MED ORDER — LOSARTAN POTASSIUM-HCTZ 100-25 MG PO TABS
1.0000 | ORAL_TABLET | Freq: Every day | ORAL | 0 refills | Status: DC
Start: 2020-04-10 — End: 2020-07-12

## 2020-04-10 MED ORDER — AMLODIPINE BESYLATE 5 MG PO TABS
5.0000 mg | ORAL_TABLET | Freq: Every day | ORAL | 0 refills | Status: DC
Start: 2020-04-10 — End: 2020-10-16

## 2020-04-10 NOTE — Telephone Encounter (Signed)
Done

## 2020-04-25 ENCOUNTER — Ambulatory Visit (INDEPENDENT_AMBULATORY_CARE_PROVIDER_SITE_OTHER): Payer: Medicare HMO | Admitting: Internal Medicine

## 2020-04-25 ENCOUNTER — Encounter (INDEPENDENT_AMBULATORY_CARE_PROVIDER_SITE_OTHER): Payer: Self-pay | Admitting: Internal Medicine

## 2020-04-25 ENCOUNTER — Other Ambulatory Visit: Payer: Self-pay

## 2020-04-25 VITALS — BP 160/78 | HR 72 | Ht 66.0 in | Wt 207.2 lb

## 2020-04-25 DIAGNOSIS — E559 Vitamin D deficiency, unspecified: Secondary | ICD-10-CM

## 2020-04-25 DIAGNOSIS — E782 Mixed hyperlipidemia: Secondary | ICD-10-CM | POA: Diagnosis not present

## 2020-04-25 DIAGNOSIS — I10 Essential (primary) hypertension: Secondary | ICD-10-CM | POA: Diagnosis not present

## 2020-04-25 NOTE — Progress Notes (Signed)
Metrics: Intervention Frequency ACO  Documented Smoking Status Yearly  Screened one or more times in 24 months  Cessation Counseling or  Active cessation medication Past 24 months  Past 24 months   Guideline developer: UpToDate (See UpToDate for funding source) Date Released: 2014       Wellness Office Visit  Subjective:  Patient ID: Patrick Nielsen, male    DOB: 12/08/1950  Age: 69 y.o. MRN: [id number]  CC: This man comes in for follow-up of hypertension, hyperlipidemia and vitamin D deficiency. HPI He had seen Maralyn Sago in September for an annual physical exam and was recommended to keep a check on his blood pressures at home.  He showed me his blood pressure readings and overall they are actually in a good range despite the blood pressure reading today.  He continues on amlodipine and a combination of losartan and hydrochlorthiazide for his hypertension. Statin therapy was discontinued as he has no history of coronary artery disease or cerebrovascular lesion we will continue to work on nutrition regarding this.  His total cholesterol was elevated at his physical in September. Vitamin D levels were in a good range and he continues to take vitamin D3 5000 units daily.  Past Medical History:  Diagnosis Date  . Essential hypertension, benign 03/02/2019  . HLD (hyperlipidemia) 03/02/2019  . Vitamin D deficiency disease 03/02/2019   History reviewed. No pertinent surgical history.   Family History  Problem Relation Age of Onset  . Emphysema Mother   . Alcohol abuse Father   . 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   Social History   Tobacco Use  . Smoking status: Former Smoker    Packs/day: 2.00    Years: 28.00    Pack years: 56.00    Types: Cigarettes    Quit date: 05/26/1996    Years since quitting: 23.9  . Smokeless tobacco: Never Used  Substance Use Topics  . Alcohol use: Yes     Alcohol/week: 12.0 standard drinks    Types: 12 Cans of beer per week    Comment: only on weekends    Current Meds  Medication Sig  . amLODipine (NORVASC) 5 MG tablet Take 1 tablet (5 mg total) by mouth daily.  . Cholecalciferol (VITAMIN D3) 125 MCG (5000 UT) TABS Take 1 tablet by mouth daily.  . fluticasone (FLONASE) 50 MCG/ACT nasal spray Place 1 spray into both nostrils daily.  Marland Kitchen losartan-hydrochlorothiazide (HYZAAR) 100-25 MG tablet Take 1 tablet by mouth daily.  . Omega-3 Fatty Acids (FISH OIL PO) Take 1 capsule by mouth daily.      Depression screen [facility] 2/9 01/25/2020 09/05/2019  Decreased Interest 0 0  Down, Depressed, Hopeless 0 0  PHQ - 2 Score 0 0     Objective:   Today's Vitals: BP (!) 160/78   Pulse 72   Ht 5\' 6"  (1.676 m)   Wt 207 lb 3.2 oz (94 kg)   BMI 33.44 kg/m  Vitals with BMI 04/25/2020 01/25/2020 12/13/2019  Height 5\' 6"  5\' 6"  5\' 6"   Weight 207 lbs 3 oz 202 lbs 10 oz 201 lbs  BMI 33.46 32.72 32.46  Systolic 160 150 12/15/2019  Diastolic 78 75 79  Pulse 72 84 84     Physical Exam  He looks systemically well.  He is obese.  He has gained 5 pounds since last visit.  Blood pressure is elevated today  although home readings are in a good range.  He is alert and orientated without any focal neurological signs.     Assessment   1. Mixed hyperlipidemia   2. Essential hypertension, benign   3. Vitamin D deficiency disease       Tests ordered No orders of the defined types were placed in this encounter.    Plan: 1. For the time being he will continue with the same antihypertensive medications, I do not think we need to change anything at the present time unless we see that his home readings are elevated. 2. He will continue to focus on nutrition and I discussed the concept of intermittent fasting which I think will help him.  If he can fast for 16 hours every day, I think you will achieve better weight control and blood pressure control. 3. He will continue  with vitamin D3 5000 units daily for vitamin D deficiency. 4. Also had a long conversation regarding COVID-19 vaccination and I think he may be convinced to go ahead and get vaccinated now. 5. Follow-up with Sarah scheduled in April 2022.   No orders of the defined types were placed in this encounter.   Wilson Singer, MD

## 2020-07-12 ENCOUNTER — Other Ambulatory Visit (INDEPENDENT_AMBULATORY_CARE_PROVIDER_SITE_OTHER): Payer: Self-pay | Admitting: Internal Medicine

## 2020-07-12 ENCOUNTER — Telehealth (INDEPENDENT_AMBULATORY_CARE_PROVIDER_SITE_OTHER): Payer: Self-pay | Admitting: Internal Medicine

## 2020-07-12 MED ORDER — LOSARTAN POTASSIUM-HCTZ 100-25 MG PO TABS
1.0000 | ORAL_TABLET | Freq: Every day | ORAL | 0 refills | Status: DC
Start: 2020-07-12 — End: 2020-10-16

## 2020-07-16 NOTE — Telephone Encounter (Signed)
Done

## 2020-09-10 ENCOUNTER — Ambulatory Visit (INDEPENDENT_AMBULATORY_CARE_PROVIDER_SITE_OTHER): Payer: Medicare HMO | Admitting: Nurse Practitioner

## 2020-09-12 ENCOUNTER — Encounter (INDEPENDENT_AMBULATORY_CARE_PROVIDER_SITE_OTHER): Payer: Medicare HMO | Admitting: Nurse Practitioner

## 2020-09-12 ENCOUNTER — Encounter (INDEPENDENT_AMBULATORY_CARE_PROVIDER_SITE_OTHER): Payer: Self-pay | Admitting: Nurse Practitioner

## 2020-10-06 ENCOUNTER — Other Ambulatory Visit (INDEPENDENT_AMBULATORY_CARE_PROVIDER_SITE_OTHER): Payer: Self-pay | Admitting: Internal Medicine

## 2020-10-06 DIAGNOSIS — E782 Mixed hyperlipidemia: Secondary | ICD-10-CM

## 2020-10-11 ENCOUNTER — Telehealth (INDEPENDENT_AMBULATORY_CARE_PROVIDER_SITE_OTHER): Payer: Self-pay | Admitting: Internal Medicine

## 2020-10-11 ENCOUNTER — Other Ambulatory Visit (INDEPENDENT_AMBULATORY_CARE_PROVIDER_SITE_OTHER): Payer: Self-pay | Admitting: Internal Medicine

## 2020-10-11 NOTE — Telephone Encounter (Signed)
I ordered these refills already earlier.

## 2020-10-16 ENCOUNTER — Ambulatory Visit (INDEPENDENT_AMBULATORY_CARE_PROVIDER_SITE_OTHER): Payer: Medicare HMO | Admitting: Nurse Practitioner

## 2020-10-16 ENCOUNTER — Telehealth (INDEPENDENT_AMBULATORY_CARE_PROVIDER_SITE_OTHER): Payer: Self-pay | Admitting: Nurse Practitioner

## 2020-10-16 ENCOUNTER — Other Ambulatory Visit: Payer: Self-pay

## 2020-10-16 ENCOUNTER — Encounter (INDEPENDENT_AMBULATORY_CARE_PROVIDER_SITE_OTHER): Payer: Self-pay | Admitting: Nurse Practitioner

## 2020-10-16 VITALS — BP 130/70 | HR 88 | Temp 97.8°F | Resp 18 | Ht 66.0 in | Wt 207.0 lb

## 2020-10-16 DIAGNOSIS — Z87891 Personal history of nicotine dependence: Secondary | ICD-10-CM

## 2020-10-16 DIAGNOSIS — Z136 Encounter for screening for cardiovascular disorders: Secondary | ICD-10-CM

## 2020-10-16 DIAGNOSIS — J302 Other seasonal allergic rhinitis: Secondary | ICD-10-CM | POA: Diagnosis not present

## 2020-10-16 DIAGNOSIS — Z0000 Encounter for general adult medical examination without abnormal findings: Secondary | ICD-10-CM

## 2020-10-16 DIAGNOSIS — Z125 Encounter for screening for malignant neoplasm of prostate: Secondary | ICD-10-CM | POA: Diagnosis not present

## 2020-10-16 MED ORDER — FLUTICASONE PROPIONATE 50 MCG/ACT NA SUSP: 1.0000 | Freq: Every day | NASAL | 1 refills | Status: DC

## 2020-10-16 NOTE — Telephone Encounter (Signed)
Ordered abd u/s to screen for AAA.

## 2020-10-16 NOTE — Progress Notes (Signed)
Subjective:   Patrick Nielsen is a 70 y.o. male who presents for Medicare Annual/Subsequent preventive examination.  Review of Systems     Cardiac Risk Factors include: advanced age (>72men, >67 women);hypertension;male gender;dyslipidemia     Objective:    Today's Vitals   10/16/20 0929  BP: 130/70  Pulse: 88  Resp: 18  Temp: 97.8 F (36.6 C)  TempSrc: Temporal  SpO2: 99%  Weight: 207 lb (93.9 kg)  Height: 5\' 6"  (1.676 m)   Body mass index is 33.41 kg/m.  Advanced Directives 10/16/2020 09/05/2019 04/10/2018 07/02/2017 04/23/2017 04/18/2017 02/26/2017  Does Patient Have a Medical Advance Directive? No No No No No No No  Would patient like information on creating a medical advance directive? No - Patient declined Yes (ED - Information included in AVS) No - Patient declined - No - Patient declined - No - Patient declined    Current Medications (verified) Outpatient Encounter Medications as of 10/16/2020  Medication Sig  . Cholecalciferol (VITAMIN D3) 125 MCG (5000 UT) TABS Take 1 tablet by mouth daily.  . hydrochlorothiazide (HYDRODIURIL) 25 MG tablet Take 1 tablet (25 mg total) by mouth daily.  10/18/2020 losartan (COZAAR) 100 MG tablet Take 1 tablet (100 mg total) by mouth daily.  . Omega-3 Fatty Acids (FISH OIL PO) Take 1 capsule by mouth daily.  . [DISCONTINUED] fluticasone (FLONASE) 50 MCG/ACT nasal spray Place 1 spray into both nostrils daily.  . fluticasone (FLONASE) 50 MCG/ACT nasal spray Place 1 spray into both nostrils daily.  . [DISCONTINUED] amLODipine (NORVASC) 5 MG tablet Take 1 tablet (5 mg total) by mouth daily. (Patient not taking: Reported on 10/16/2020)  . [DISCONTINUED] losartan-hydrochlorothiazide (HYZAAR) 100-25 MG tablet Take 1 tablet by mouth daily.   No facility-administered encounter medications on file as of 10/16/2020.    Allergies (verified) Patient has no known allergies.   History: Past Medical History:  Diagnosis Date  . Essential hypertension, benign  03/02/2019  . HLD (hyperlipidemia) 03/02/2019  . Vitamin D deficiency disease 03/02/2019   History reviewed. No pertinent surgical history. Family History  Problem Relation Age of Onset  . Emphysema Mother   . Alcohol abuse Father   . Emphysema Brother    Social History   Socioeconomic History  . Marital status: Married    Spouse name: Not on file  . Number of children: Not on file  . Years of education: Not on file  . Highest education level: Not on file  Occupational History  . Not on file  Tobacco Use  . Smoking status: Former Smoker    Packs/day: 2.00    Years: 28.00    Pack years: 56.00    Types: Cigarettes    Quit date: 05/26/1996    Years since quitting: 24.4  . Smokeless tobacco: Never Used  Substance and Sexual Activity  . Alcohol use: Yes    Alcohol/week: 12.0 standard drinks    Types: 12 Cans of beer per week    Comment: only on weekends  . Drug use: No  . Sexual activity: Not on file  Other Topics Concern  . Not on file  Social History Narrative   Married for 30 years.07/24/1996, will continue working and not retire.   Social Determinants of Health   Financial Resource Strain: Not on file  Food Insecurity: Not on file  Transportation Needs: Not on file  Physical Activity: Not on file  Stress: Not on file  Social Connections: Not on file    Tobacco Counseling Counseling  given: Not Answered   Clinical Intake:  Pre-visit preparation completed: Yes  Pain : No/denies pain     BMI - recorded: 33.41 Nutritional Status: BMI > 30  Obese Nutritional Risks: None Diabetes: No  How often do you need to have someone help you when you read instructions, pamphlets, or other written materials from your doctor or pharmacy?: 3 - Sometimes What is the last grade level you completed in school?: 6th grade  Diabetic? no     Information entered by :: Jiles ProwsSarah Alf Doyle, NP-C   Activities of Daily Living In your present state of health, do you have any  difficulty performing the following activities: 10/16/2020  Hearing? N  Vision? N  Difficulty concentrating or making decisions? N  Walking or climbing stairs? N  Dressing or bathing? N  Doing errands, shopping? N  Preparing Food and eating ? N  Using the Toilet? N  In the past six months, have you accidently leaked urine? N  Do you have problems with loss of bowel control? N  Managing your Medications? N  Managing your Finances? N  Housekeeping or managing your Housekeeping? N  Some recent data might be hidden    Patient Care Team: Elenore PaddyGray, Cherl Gorney E, NP as PCP - General (Nurse Practitioner)  Indicate any recent Medical Services you may have received from other than [facility] providers in the past year (date may be approximate).     Assessment:   This is a routine wellness examination for Patrick HansenGerald.  Hearing/Vision screen No exam data present  Dietary issues and exercise activities discussed: Current Exercise Habits: The patient does not participate in regular exercise at present;The patient has a physically strenuous job, but has no regular exercise apart from work., Exercise limited by: cardiac condition(s)  Goals Addressed   None    Depression Screen PHQ 2/9 Scores 10/16/2020 01/25/2020 09/05/2019  PHQ - 2 Score 0 0 0  Exception Documentation - Medical reason Medical reason    Fall Risk Fall Risk  10/16/2020 01/25/2020 09/05/2019 03/02/2019  Falls in the past year? 0 0 0 0  Number falls in past yr: 0 0 0 -  Injury with Fall? 0 0 0 -  Risk for fall due to : - No Fall Risks No Fall Risks -  Follow up Falls evaluation completed Falls evaluation completed Falls evaluation completed -    FALL RISK PREVENTION PERTAINING TO THE HOME:  Any stairs in or around the home? Yes  If so, are there any without handrails? No  Home free of loose throw rugs in walkways, pet beds, electrical cords, etc? No  Adequate lighting in your home to reduce risk of falls? Yes   ASSISTIVE DEVICES UTILIZED TO  PREVENT FALLS:  Life alert? No  Use of a cane, walker or w/c? No  Grab bars in the bathroom? No  Shower chair or bench in shower? No  Elevated toilet seat or a handicapped toilet? No   TIMED UP AND GO:  Was the test performed? Yes .  Length of time to ambulate 10 feet: 7 sec.   Gait steady and fast without use of assistive device  Cognitive Function:     6CIT Screen 10/16/2020 09/05/2019  What Year? 0 points 0 points  What month? 0 points 0 points  What time? 0 points 0 points  Count back from 20 0 points 0 points  Months in reverse 4 points 4 points  Repeat phrase 2 points 10 points  Total Score 6 14  Immunizations Immunization History  Administered Date(s) Administered  . Fluad Quad(high Dose 65+) 02/27/2020  . Influenza-Unspecified 03/02/2019  . Pneumococcal Conjugate-13 08/05/2018  . Pneumococcal Polysaccharide-23 09/05/2019  . Tdap 03/02/2019  . Unspecified SARS-COV-2 Vaccination 05/26/2020, 06/26/2020    TDAP status: Up to date  Flu Vaccine status: Up to date  Pneumococcal vaccine status: Up to date  Covid-19 vaccine status: Completed vaccines - due for booster in August  Qualifies for Shingles Vaccine? No   Zostavax completed Yes   Shingrix Completed?: No.    Education has been provided regarding the importance of this vaccine. Patient has been advised to call insurance company to determine out of pocket expense if they have not yet received this vaccine. Advised may also receive vaccine at local pharmacy or Health Dept. Verbalized acceptance and understanding.  Screening Tests Health Maintenance  Topic Date Due  . COLONOSCOPY (Pts 45-59yrs Insurance coverage will need to be confirmed)  10/16/2021 (Originally 12/17/1997)  . COVID-19 Vaccine (3 - Booster) 11/23/2020  . INFLUENZA VACCINE  12/24/2020  . TETANUS/TDAP  03/01/2029  . Hepatitis C Screening  Completed  . PNA vac Low Risk Adult  Completed  . HPV VACCINES  Aged Out    Health  Maintenance  There are no preventive care reminders to display for this patient.  Colonoscopy- per patient will discuss further at next office visit. He does not want referral today.  Lung Cancer Screening: (Low Dose CT Chest recommended if Age 70-80 years, 30 pack-year currently smoking OR have quit w/in 15years.) does not qualify.   Lung Cancer Screening Referral: N/A  Additional Screening:  Hepatitis C Screening: does not qualify; Completed 07/2018  Vision Screening: Recommended annual ophthalmology exams for early detection of glaucoma and other disorders of the eye. Is the patient up to date with their annual eye exam?  Yes  Who is the provider or what is the name of the office in which the patient attends annual eye exams? [facility] If pt is not established with a provider, would they like to be referred to a provider to establish care? No .   Dental Screening: Recommended annual dental exams for proper oral hygiene  Community Resource Referral / Chronic Care Management: CRR required this visit?  No   CCM required this visit?  No      Plan:    Patient is up-to-date with most recommended screenings and vaccines.  He does plan on getting his booster when this is due in August.  We will screen for prostate cancer by collecting PSA.  We will also send for ultrasound of abdomen for screening for AAA.  He used to smoke, quit approximately 28 years ago.  He does report smoking 2-1/2 to 3 packs/day for approximately 27 years.  Thus his pack year is greater than 60.  I have personally reviewed and noted the following in the patient's chart:   . Medical and social history . Use of alcohol, tobacco or illicit drugs  . Current medications and supplements including opioid prescriptions. Patient is not currently taking opioid prescriptions. . Functional ability and status . Nutritional status . Physical activity . Advanced directives . List of other physicians . Hospitalizations,  surgeries, and ER visits in previous 12 months . Vitals . Screenings to include cognitive, depression, and falls . Referrals and appointments  In addition, I have reviewed and discussed with patient certain preventive protocols, quality metrics, and best practice recommendations. A written personalized care plan for preventive services as well  as general preventive health recommendations were provided to patient.    He will follow-up as scheduled next month and again next year for annual wellness visit. Elenore Paddy, NP   10/16/2020

## 2020-10-16 NOTE — Patient Instructions (Signed)
  Mr. Patrick Nielsen , Thank you for taking time to come for your Medicare Wellness Visit. I appreciate your ongoing commitment to your health goals. Please review the following plan we discussed and let me know if I can assist you in the future.   These are the goals we discussed: Goals   None     This is a list of the screening recommended for you and due dates:  Health Maintenance  Topic Date Due  . Colon Cancer Screening  10/16/2021*  . COVID-19 Vaccine (3 - Booster) 11/23/2020  . Flu Shot  12/24/2020  . Tetanus Vaccine  03/01/2029  . Hepatitis C Screening: USPSTF Recommendation to screen - Ages 61-79 yo.  Completed  . Pneumonia vaccines  Completed  . HPV Vaccine  Aged Out  *Topic was postponed. The date shown is not the original due date.

## 2020-10-17 ENCOUNTER — Telehealth (INDEPENDENT_AMBULATORY_CARE_PROVIDER_SITE_OTHER): Payer: Self-pay | Admitting: Nurse Practitioner

## 2020-10-17 ENCOUNTER — Encounter (INDEPENDENT_AMBULATORY_CARE_PROVIDER_SITE_OTHER): Payer: Self-pay | Admitting: Nurse Practitioner

## 2020-10-17 LAB — PSA: PSA: 1.84 ng/mL (ref <=4.00)

## 2020-10-17 NOTE — Telephone Encounter (Signed)
Please print and mail letter that was written by myself to the patient on 10/17/20. Thank you.

## 2020-10-17 NOTE — Telephone Encounter (Signed)
Done

## 2020-10-18 NOTE — Telephone Encounter (Signed)
Working on ins prior [place] .

## 2020-10-25 ENCOUNTER — Other Ambulatory Visit (INDEPENDENT_AMBULATORY_CARE_PROVIDER_SITE_OTHER): Payer: Self-pay | Admitting: Internal Medicine

## 2020-10-25 DIAGNOSIS — E782 Mixed hyperlipidemia: Secondary | ICD-10-CM

## 2020-10-30 ENCOUNTER — Ambulatory Visit (HOSPITAL_COMMUNITY): Admission: RE | Admit: 2020-10-30 | Payer: Medicare HMO | Source: Ambulatory Visit

## 2020-10-31 ENCOUNTER — Other Ambulatory Visit: Payer: Self-pay

## 2020-10-31 ENCOUNTER — Encounter (INDEPENDENT_AMBULATORY_CARE_PROVIDER_SITE_OTHER): Payer: Self-pay | Admitting: Internal Medicine

## 2020-10-31 ENCOUNTER — Ambulatory Visit (INDEPENDENT_AMBULATORY_CARE_PROVIDER_SITE_OTHER): Payer: Medicare HMO | Admitting: Internal Medicine

## 2020-10-31 VITALS — BP 123/70 | HR 70 | Temp 97.1°F | Resp 18 | Ht 66.0 in | Wt 206.0 lb

## 2020-10-31 DIAGNOSIS — I10 Essential (primary) hypertension: Secondary | ICD-10-CM

## 2020-10-31 DIAGNOSIS — E782 Mixed hyperlipidemia: Secondary | ICD-10-CM | POA: Diagnosis not present

## 2020-10-31 DIAGNOSIS — E559 Vitamin D deficiency, unspecified: Secondary | ICD-10-CM | POA: Diagnosis not present

## 2020-10-31 NOTE — Progress Notes (Signed)
Metrics: Intervention Frequency ACO  Documented Smoking Status Yearly  Screened one or more times in 24 months  Cessation Counseling or  Active cessation medication Past 24 months  Past 24 months   Guideline developer: UpToDate (See UpToDate for funding source) Date Released: 2014       Wellness Office Visit  Subjective:  Patient ID: Patrick Nielsen, male    DOB: 1950/07/08  Age: 70 y.o. MRN: [id number]  CC: This man comes in for follow-up of hypertension, hyperlipidemia and vitamin D deficiency. HPI  We agreed to discontinue statin therapy a long time ago when he was trying to focus on nutrition. He continues with hydrochlorothiazide and losartan for hypertension. He has been taking vitamin D3 5000 units daily. Denies any chest pain, dyspnea, palpitations or limb weakness. Past Medical History:  Diagnosis Date  . Essential hypertension, benign 03/02/2019  . HLD (hyperlipidemia) 03/02/2019  . Vitamin D deficiency disease 03/02/2019   History reviewed. No pertinent surgical history.   Family History  Problem Relation Age of Onset  . Emphysema Mother   . Alcohol abuse Father   . 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   Social History   Tobacco Use  . Smoking status: Former Smoker    Packs/day: 2.00    Years: 28.00    Pack years: 56.00    Types: Cigarettes    Quit date: 05/26/1996    Years since quitting: 24.4  . Smokeless tobacco: Never Used  Substance Use Topics  . Alcohol use: Yes    Alcohol/week: 12.0 standard drinks    Types: 12 Cans of beer per week    Comment: only on weekends    Current Meds  Medication Sig  . Cholecalciferol (VITAMIN D3) 125 MCG (5000 UT) TABS Take 1 tablet by mouth daily.  . fluticasone (FLONASE) 50 MCG/ACT nasal spray Place 1 spray into both nostrils daily.  . hydrochlorothiazide (HYDRODIURIL) 25 MG tablet Take 1 tablet (25 mg total) by  mouth daily.  Marland Kitchen losartan (COZAAR) 100 MG tablet Take 1 tablet (100 mg total) by mouth daily.  . Omega-3 Fatty Acids (FISH OIL PO) Take 1 capsule by mouth daily.  . [DISCONTINUED] atorvastatin (LIPITOR) 10 MG tablet SMARTSIG:1 Tablet(s) By Mouth Every Evening       Objective:   Today's Vitals: BP 123/70 (BP Location: Left Arm, Patient Position: Sitting, Cuff Size: Normal)   Pulse 70   Temp (!) 97.1 F (36.2 C) (Temporal)   Resp 18   Ht 5\' 6"  (1.676 m)   Wt 206 lb (93.4 kg)   SpO2 98%   BMI 33.25 kg/m  Vitals with BMI 10/31/2020 10/16/2020 04/25/2020  Height 5\' 6"  5\' 6"  5\' 6"   Weight 206 lbs 207 lbs 207 lbs 3 oz  BMI 33.27 33.43 33.46  Systolic 123 130 14/05/2019  Diastolic 70 70 78  Pulse 70 88 72     Physical Exam  He remains obese.  He has not lost any weight.  Blood pressure is much improved now.     Assessment   1. Mixed hyperlipidemia   2. Essential hypertension, benign   3. Vitamin D deficiency disease       Tests ordered Orders Placed This Encounter  Procedures  . COMPLETE METABOLIC PANEL WITH GFR  . Lipid panel  . VITAMIN D 25 Hydroxy (Vit-D Deficiency, Fractures)     Plan: 1. Continue with antihypertensive  medications which are controlling his blood pressure well.  Check renal function. 2. Continue with vitamin D3 5000 units daily and we will check vitamin D levels. 3. I will check a lipid panel also. 4. Further recommendations will depend on blood results and I will have him follow-up with Sarah in about 4 months.   No orders of the defined types were placed in this encounter.   Wilson Singer, MD

## 2020-11-01 LAB — LIPID PANEL
Cholesterol: 202 mg/dL — ABNORMAL HIGH (ref <200)
HDL: 43 mg/dL (ref >=40)
LDL Cholesterol (Calc): 118 mg/dL (calc) — ABNORMAL HIGH
Non-HDL Cholesterol (Calc): 159 mg/dL (calc) — ABNORMAL HIGH (ref <130)
Total CHOL/HDL Ratio: 4.7 (calc) (ref <5.0)
Triglycerides: 285 mg/dL — ABNORMAL HIGH (ref <150)

## 2020-11-01 LAB — COMPLETE METABOLIC PANEL WITH GFR
AG Ratio: 1.4 (calc) (ref 1.0–2.5)
ALT: 32 U/L (ref 9–46)
AST: 24 U/L (ref 10–35)
Albumin: 4.1 g/dL (ref 3.6–5.1)
Alkaline phosphatase (APISO): 62 U/L (ref 35–144)
BUN: 18 mg/dL (ref 7–25)
CO2: 29 mmol/L (ref 20–32)
Calcium: 9.7 mg/dL (ref 8.6–10.3)
Chloride: 102 mmol/L (ref 98–110)
Creat: 0.78 mg/dL (ref 0.70–1.25)
GFR, Est African American: 108 mL/min/{1.73_m2} (ref >=60)
GFR, Est Non African American: 93 mL/min/{1.73_m2} (ref >=60)
Globulin: 3 g/dL (calc) (ref 1.9–3.7)
Glucose, Bld: 110 mg/dL (ref 65–139)
Potassium: 4.1 mmol/L (ref 3.5–5.3)
Sodium: 138 mmol/L (ref 135–146)
Total Bilirubin: 0.5 mg/dL (ref 0.2–1.2)
Total Protein: 7.1 g/dL (ref 6.1–8.1)

## 2020-11-01 LAB — VITAMIN D 25 HYDROXY (VIT D DEFICIENCY, FRACTURES): Vit D, 25-Hydroxy: 58 ng/mL (ref 30–100)

## 2020-11-04 ENCOUNTER — Other Ambulatory Visit (INDEPENDENT_AMBULATORY_CARE_PROVIDER_SITE_OTHER): Payer: Self-pay | Admitting: Internal Medicine

## 2020-11-07 ENCOUNTER — Telehealth (INDEPENDENT_AMBULATORY_CARE_PROVIDER_SITE_OTHER): Payer: Self-pay | Admitting: Nurse Practitioner

## 2020-11-07 NOTE — Telephone Encounter (Signed)
I called this patient and gave him the message and asked him which one he would like to proceed with and he stated that he would like to wait until his next visit and discuss with you then. Patient verbalized an understanding.

## 2020-11-07 NOTE — Telephone Encounter (Signed)
Please see message. °

## 2020-11-07 NOTE — Telephone Encounter (Signed)
Please call patient and ask him if he is willing to have colon cancer screening via colonoscopy.  I had discussed this with him at a previous visit, but he want to wait until later in the summer to make a decision.  If he is willing to undergo colonoscopy I can order referral to gastroenterologist.  Thank you.

## 2020-11-10 ENCOUNTER — Other Ambulatory Visit (INDEPENDENT_AMBULATORY_CARE_PROVIDER_SITE_OTHER): Payer: Self-pay | Admitting: Internal Medicine

## 2020-11-10 DIAGNOSIS — E782 Mixed hyperlipidemia: Secondary | ICD-10-CM

## 2021-01-29 ENCOUNTER — Encounter (INDEPENDENT_AMBULATORY_CARE_PROVIDER_SITE_OTHER): Payer: Medicare HMO | Admitting: Internal Medicine

## 2021-02-11 ENCOUNTER — Ambulatory Visit (INDEPENDENT_AMBULATORY_CARE_PROVIDER_SITE_OTHER): Payer: Medicare HMO | Admitting: Nurse Practitioner

## 2021-02-11 ENCOUNTER — Other Ambulatory Visit: Payer: Self-pay

## 2021-02-11 ENCOUNTER — Encounter: Payer: Self-pay | Admitting: Nurse Practitioner

## 2021-02-11 VITALS — BP 136/82 | HR 94 | Temp 97.1°F | Ht 68.5 in | Wt 208.0 lb

## 2021-02-11 DIAGNOSIS — I10 Essential (primary) hypertension: Secondary | ICD-10-CM

## 2021-02-11 DIAGNOSIS — I700 Atherosclerosis of aorta: Secondary | ICD-10-CM

## 2021-02-11 DIAGNOSIS — E782 Mixed hyperlipidemia: Secondary | ICD-10-CM | POA: Diagnosis not present

## 2021-02-11 DIAGNOSIS — E669 Obesity, unspecified: Secondary | ICD-10-CM

## 2021-02-11 DIAGNOSIS — M8949 Other hypertrophic osteoarthropathy, multiple sites: Secondary | ICD-10-CM

## 2021-02-11 DIAGNOSIS — E559 Vitamin D deficiency, unspecified: Secondary | ICD-10-CM

## 2021-02-11 DIAGNOSIS — Z23 Encounter for immunization: Secondary | ICD-10-CM | POA: Diagnosis not present

## 2021-02-11 DIAGNOSIS — M159 Polyosteoarthritis, unspecified: Secondary | ICD-10-CM

## 2021-02-11 DIAGNOSIS — R69 Illness, unspecified: Secondary | ICD-10-CM | POA: Diagnosis not present

## 2021-02-11 DIAGNOSIS — F1010 Alcohol abuse, uncomplicated: Secondary | ICD-10-CM

## 2021-02-11 MED ORDER — HYDROCHLOROTHIAZIDE 25 MG PO TABS
25.0000 mg | ORAL_TABLET | Freq: Every day | ORAL | 1 refills | Status: DC
Start: 2021-02-11 — End: 2021-10-04

## 2021-02-11 MED ORDER — LOSARTAN POTASSIUM 100 MG PO TABS: 100.0000 mg | ORAL_TABLET | Freq: Every day | ORAL | 1 refills | Status: DC

## 2021-02-11 NOTE — Progress Notes (Signed)
Careteam: Patient Care Team: Patrick Paddy, NP as PCP - General (Nurse Practitioner)  PLACE OF SERVICE:  [facility]  Advanced Directive information    No Known Allergies  Chief Complaint  Patient presents with   Establish Care    New patient to establish care. Pill bottles verified at initial visit. Patients last PCP discontinued cholesterol medication. Flu vaccine today.      HPI: Patient is a 70 y.o. male to establish care.   Seasonally allergies- uses flonase PRN.   Hypertension- stable on hydrochlorothiazide, losartan 100 mg daily   Osteoarthritis in hard- uses ibuprofen PRN  Hyperlipidemia- using fish oil.   Vit d def- on supplement.   He continues to work- very active on the job  Drinks about 6 beer a day  Review of Systems:  Review of Systems  Constitutional:  Negative for chills, fever and weight loss.  HENT:  Negative for tinnitus.   Respiratory:  Negative for cough, sputum production and shortness of breath.   Cardiovascular:  Negative for chest pain, palpitations and leg swelling.  Gastrointestinal:  Negative for abdominal pain, constipation, diarrhea and heartburn.  Genitourinary:  Negative for dysuria, frequency and urgency.  Musculoskeletal:  Negative for back pain, falls, joint pain and myalgias.  Skin: Negative.   Neurological:  Negative for dizziness and headaches.  Psychiatric/Behavioral:  Negative for depression and memory loss. The patient does not have insomnia.    Past Medical History:  Diagnosis Date   Essential hypertension, benign 03/02/2019   HLD (hyperlipidemia) 03/02/2019   Vitamin D deficiency disease 03/02/2019   History reviewed. No pertinent surgical history. Social History:   reports that he quit smoking about 24 years ago. His smoking use included cigarettes. He has a 60.00 pack-year smoking history. He has never used smokeless tobacco. He reports current alcohol use of about 12.0 standard drinks per week. He reports that he  does not use drugs.  Family History  Problem Relation Age of Onset   Emphysema Mother    Alcohol abuse Father    Emphysema Brother    Healthy Son    Healthy Daughter    Healthy Daughter     Medications: Patient's Medications  New Prescriptions   No medications on file  Previous Medications   CHOLECALCIFEROL (VITAMIN D3) 125 MCG (5000 UT) TABS    Take 1 tablet by mouth daily.   FLUTICASONE (FLONASE) 50 MCG/ACT NASAL SPRAY    Place 1 spray into both nostrils daily.   HYDROCHLOROTHIAZIDE (HYDRODIURIL) 25 MG TABLET    Take 1 tablet (25 mg total) by mouth daily.   IBUPROFEN (ADVIL) 200 MG TABLET    Take 200 mg by mouth as needed.   LOSARTAN (COZAAR) 100 MG TABLET    Take 1 tablet (100 mg total) by mouth daily.   OMEGA-3 FATTY ACIDS (FISH OIL PO)    Take 1 capsule by mouth daily.  Modified Medications   No medications on file  Discontinued Medications   ATORVASTATIN (LIPITOR) 10 MG TABLET    TAKE 1 TABLET BY MOUTH EVERY DAY IN THE EVENING    Physical Exam:  Vitals:   02/11/21 1326  BP: 136/82  Pulse: 94  Temp: (!) 97.1 F (36.2 C)  TempSrc: Temporal  SpO2: 97%  Weight: 208 lb (94.3 kg)  Height: 5' 8.5" (1.74 m)   Body mass index is 31.17 kg/m. Wt Readings from Last 3 Encounters:  02/11/21 208 lb (94.3 kg)  10/31/20 206 lb (93.4 kg)  10/16/20 207 lb (93.9 kg)    Physical Exam Constitutional:      General: He is not in acute distress.    Appearance: He is well-developed. He is obese. He is not diaphoretic.  HENT:     Head: Normocephalic and atraumatic.     Right Ear: External ear normal.     Left Ear: External ear normal.     Mouth/Throat:     Pharynx: No oropharyngeal exudate.  Eyes:     Conjunctiva/sclera: Conjunctivae normal.     Pupils: Pupils are equal, round, and reactive to light.  Cardiovascular:     Rate and Rhythm: Normal rate and regular rhythm.     Heart sounds: Normal heart sounds.  Pulmonary:     Effort: Pulmonary effort is normal.     Breath  sounds: Normal breath sounds.  Abdominal:     General: Bowel sounds are normal.     Palpations: Abdomen is soft.  Musculoskeletal:        General: No tenderness.     Cervical back: Normal range of motion and neck supple.     Right lower leg: No edema.     Left lower leg: No edema.  Skin:    General: Skin is warm and dry.  Neurological:     Mental Status: He is alert and oriented to person, place, and time.  Psychiatric:        Mood and Affect: Mood normal.    Labs reviewed: Basic Metabolic Panel: Recent Labs    10/31/20 1045  NA 138  K 4.1  CL 102  CO2 29  GLUCOSE 110  BUN 18  CREATININE 0.78  CALCIUM 9.7   Liver Function Tests: Recent Labs    10/31/20 1045  AST 24  ALT 32  BILITOT 0.5  PROT 7.1   No results for input(s): LIPASE, AMYLASE in the last 8760 hours. No results for input(s): AMMONIA in the last 8760 hours. CBC: No results for input(s): WBC, NEUTROABS, HGB, HCT, MCV, PLT in the last 8760 hours. Lipid Panel: Recent Labs    10/31/20 1045  CHOL 202*  HDL 43  LDLCALC 118*  TRIG 285*  CHOLHDL 4.7   TSH: No results for input(s): TSH in the last 8760 hours. A1C: Lab Results  Component Value Date   HGBA1C 5.6 01/25/2020     Assessment/Plan 1 Need for influenza vaccination - Flu Vaccine QUAD High Dose(Fluad)  2. Aortic atherosclerosis (HCC) Noted on imaging. Previously was on statin but this was stopped -encouraged to start ASA 81 mg daily  3. Essential hypertension, benign -controlled on current regimen. Dietary modifications encouraged - losartan (COZAAR) 100 MG tablet; Take 1 tablet (100 mg total) by mouth daily.  Dispense: 90 tablet; Refill: 1 - hydrochlorothiazide (HYDRODIURIL) 25 MG tablet; Take 1 tablet (25 mg total) by mouth daily.  Dispense: 90 tablet; Refill: 1  4. Mixed hyperlipidemia -previously on statin. Stopped. Encouraged heart healthy diet. Decreasing amount of beer daily. Will follow up lipids at next viist.   5.  Vitamin D deficiency Vit d level in normal range on last lab. Continue supplement.   6. Obesity (BMI 30-39.9) -education provided on healthy weight loss through increase in physical activity and proper nutrition   7. Primary osteoarthritis involving multiple joints Encouraged not to use NSAIDs routinely due to adverse effects. To use tylenol 325 mg 2 tablets every 6 hours PRN, will need to minimized ETOH intake.   8. ETOH abuse. Encouraged to decrease amount of  alcohol daily, no more than 2/day. Reports drinking 6 beers every day.    Next appt: 3 months, will get fasting labs at that time.  Janene Harvey. Biagio Borg  [facility] [phone]

## 2021-02-11 NOTE — Patient Instructions (Addendum)
To start baby ASA 81 mg daily   Heart healthy diet recommended.   Schedule AWV (last was 10/16/20)

## 2021-02-21 ENCOUNTER — Ambulatory Visit (INDEPENDENT_AMBULATORY_CARE_PROVIDER_SITE_OTHER): Payer: Medicare HMO | Admitting: Nurse Practitioner

## 2021-04-15 ENCOUNTER — Other Ambulatory Visit (INDEPENDENT_AMBULATORY_CARE_PROVIDER_SITE_OTHER): Payer: Self-pay | Admitting: Nurse Practitioner

## 2021-04-15 DIAGNOSIS — J302 Other seasonal allergic rhinitis: Secondary | ICD-10-CM

## 2021-05-13 ENCOUNTER — Encounter: Payer: Self-pay | Admitting: Nurse Practitioner

## 2021-05-13 ENCOUNTER — Ambulatory Visit (INDEPENDENT_AMBULATORY_CARE_PROVIDER_SITE_OTHER): Payer: Medicare HMO | Admitting: Nurse Practitioner

## 2021-05-13 ENCOUNTER — Other Ambulatory Visit: Payer: Self-pay

## 2021-05-13 VITALS — BP 138/90 | HR 88 | Temp 97.3°F | Ht 68.5 in | Wt 211.0 lb

## 2021-05-13 DIAGNOSIS — M159 Polyosteoarthritis, unspecified: Secondary | ICD-10-CM | POA: Diagnosis not present

## 2021-05-13 DIAGNOSIS — E782 Mixed hyperlipidemia: Secondary | ICD-10-CM | POA: Diagnosis not present

## 2021-05-13 DIAGNOSIS — Z1212 Encounter for screening for malignant neoplasm of rectum: Secondary | ICD-10-CM | POA: Diagnosis not present

## 2021-05-13 DIAGNOSIS — I10 Essential (primary) hypertension: Secondary | ICD-10-CM | POA: Diagnosis not present

## 2021-05-13 DIAGNOSIS — E669 Obesity, unspecified: Secondary | ICD-10-CM

## 2021-05-13 DIAGNOSIS — I700 Atherosclerosis of aorta: Secondary | ICD-10-CM

## 2021-05-13 DIAGNOSIS — R69 Illness, unspecified: Secondary | ICD-10-CM | POA: Diagnosis not present

## 2021-05-13 DIAGNOSIS — M150 Primary generalized (osteo)arthritis: Secondary | ICD-10-CM

## 2021-05-13 DIAGNOSIS — Z1211 Encounter for screening for malignant neoplasm of colon: Secondary | ICD-10-CM | POA: Diagnosis not present

## 2021-05-13 DIAGNOSIS — F1010 Alcohol abuse, uncomplicated: Secondary | ICD-10-CM

## 2021-05-13 NOTE — Patient Instructions (Addendum)
COVID booster and shingles vaccine recommended to get at local pharmacy.

## 2021-05-13 NOTE — Progress Notes (Signed)
Careteam: Patient Care Team: Lauree Chandler, NP as PCP - General (Geriatric Medicine)  PLACE OF SERVICE:  [place] Directive information Does Patient Have a Medical Advance Directive?: Yes, Does patient want to make changes to medical advance directive?: Yes (MAU/Ambulatory/Procedural Areas - Information given)  No Known Allergies  Chief Complaint  Patient presents with   Medical Management of Chronic Issues    3 month follow-up. Discuss need for shingrix and covid booster or post pone/exclude if patient refuses.      HPI: Patient is a 70 y.o. male for routine follow up.   had breakfast this morning.   Hyperlipidemia- off medication. He was not having side effects.   OA- using baby ASA only. Pain has been stable.   He reports he likes his beer- continues to drink 6-7 beers in the evening.   Review of Systems:  Review of Systems  Constitutional:  Negative for chills, fever and weight loss.  HENT:  Negative for tinnitus.   Respiratory:  Negative for cough, sputum production and shortness of breath.   Cardiovascular:  Negative for chest pain, palpitations and leg swelling.  Gastrointestinal:  Negative for abdominal pain, constipation, diarrhea and heartburn.  Genitourinary:  Negative for dysuria, frequency and urgency.  Musculoskeletal:  Negative for back pain, falls, joint pain and myalgias.  Skin: Negative.   Neurological:  Negative for dizziness and headaches.  Psychiatric/Behavioral:  Negative for depression and memory loss. The patient does not have insomnia.    Past Medical History:  Diagnosis Date   Essential hypertension, benign 03/02/2019   HLD (hyperlipidemia) 03/02/2019   Vitamin D deficiency disease 03/02/2019   History reviewed. No pertinent surgical history. Social History:   reports that he quit smoking about 24 years ago. His smoking use included cigarettes. He has a 60.00 pack-year smoking history. He has never used smokeless tobacco.  He reports current alcohol use of about 42.0 standard drinks per week. He reports that he does not use drugs.  Family History  Problem Relation Age of Onset   Emphysema Mother    Alcohol abuse Father    Emphysema Brother    Healthy Son    Healthy Daughter    Healthy Daughter     Medications: Patient's Medications  New Prescriptions   No medications on file  Previous Medications   CHOLECALCIFEROL (VITAMIN D3) 125 MCG (5000 UT) TABS    Take 1 tablet by mouth daily.   FLUTICASONE (FLONASE) 50 MCG/ACT NASAL SPRAY    Place 1 spray into both nostrils daily.   HYDROCHLOROTHIAZIDE (HYDRODIURIL) 25 MG TABLET    Take 1 tablet (25 mg total) by mouth daily.   LOSARTAN (COZAAR) 100 MG TABLET    Take 1 tablet (100 mg total) by mouth daily.   OMEGA-3 FATTY ACIDS (FISH OIL PO)    Take 1 capsule by mouth daily.  Modified Medications   No medications on file  Discontinued Medications   No medications on file    Physical Exam:  Vitals:   05/13/21 1418  BP: 138/90  Pulse: 88  Temp: (!) 97.3 F (36.3 C)  TempSrc: Temporal  SpO2: 98%  Weight: 211 lb (95.7 kg)  Height: 5' 8.5" (1.74 m)   Body mass index is 31.62 kg/m. Wt Readings from Last 3 Encounters:  05/13/21 211 lb (95.7 kg)  02/11/21 208 lb (94.3 kg)  10/31/20 206 lb (93.4 kg)    Physical Exam Constitutional:      General: He is  not in acute distress.    Appearance: He is well-developed. He is not diaphoretic.  HENT:     Head: Normocephalic and atraumatic.     Right Ear: External ear normal.     Left Ear: External ear normal.     Mouth/Throat:     Pharynx: No oropharyngeal exudate.  Eyes:     Conjunctiva/sclera: Conjunctivae normal.     Pupils: Pupils are equal, round, and reactive to light.  Cardiovascular:     Rate and Rhythm: Normal rate and regular rhythm.     Heart sounds: Normal heart sounds.  Pulmonary:     Effort: Pulmonary effort is normal.     Breath sounds: Normal breath sounds.  Abdominal:     General:  Bowel sounds are normal.     Palpations: Abdomen is soft.  Musculoskeletal:        General: No tenderness.     Cervical back: Normal range of motion and neck supple.     Right lower leg: No edema.     Left lower leg: No edema.  Skin:    General: Skin is warm and dry.  Neurological:     Mental Status: He is alert and oriented to person, place, and time.  Psychiatric:        Mood and Affect: Mood normal.    Labs reviewed: Basic Metabolic Panel: Recent Labs    10/31/20 1045  NA 138  K 4.1  CL 102  CO2 29  GLUCOSE 110  BUN 18  CREATININE 0.78  CALCIUM 9.7   Liver Function Tests: Recent Labs    10/31/20 1045  AST 24  ALT 32  BILITOT 0.5  PROT 7.1   No results for input(s): LIPASE, AMYLASE in the last 8760 hours. No results for input(s): AMMONIA in the last 8760 hours. CBC: No results for input(s): WBC, NEUTROABS, HGB, HCT, MCV, PLT in the last 8760 hours. Lipid Panel: Recent Labs    10/31/20 1045  CHOL 202*  HDL 43  LDLCALC 118*  TRIG 285*  CHOLHDL 4.7   TSH: No results for input(s): TSH in the last 8760 hours. A1C: Lab Results  Component Value Date   HGBA1C 5.6 01/25/2020     Assessment/Plan 1. Essential hypertension, benign --stable. Goal bp <140/90. Continue on losartan and HCTZ  with low sodium diet.  - CMP with eGFR([facility]) - CBC with Differential/Platelet  2. Aortic atherosclerosis (HCC) -continues on ASA daily.  - CBC with Differential/Platelet  3. Mixed hyperlipidemia -dietary modifications encouraged, previously on statin but stopped by prior PCP, will follow up today  - Lipid panel - CMP with eGFR([facility])  4. Primary osteoarthritis involving multiple joints -stable at this time. Continue to work on Editor, commissioning   5. Encounter for colorectal cancer screening -does not wish to have colonoscopy but agreeable to cologuard  - Cologuard  6. Obesity (BMI 30-39.9) -education provided on healthy weight loss through increase in  physical activity and proper nutrition   7. ETOH abuse -encouraged cessation.    Next appt: 6 months.  Carlos American. [place] [phone]

## 2021-05-14 LAB — CBC WITH DIFFERENTIAL/PLATELET
Absolute Monocytes: 945 cells/uL (ref 200–950)
Basophils Absolute: 53 cells/uL (ref 0–200)
Basophils Relative: 0.7 %
Eosinophils Absolute: 98 cells/uL (ref 15–500)
Eosinophils Relative: 1.3 %
HCT: 44.7 % (ref 38.5–50.0)
Hemoglobin: 15.6 g/dL (ref 13.2–17.1)
Lymphs Abs: 2055 cells/uL (ref 850–3900)
MCH: 32.4 pg (ref 27.0–33.0)
MCHC: 34.9 g/dL (ref 32.0–36.0)
MCV: 92.9 fL (ref 80.0–100.0)
MPV: 11.2 fL (ref 7.5–12.5)
Monocytes Relative: 12.6 %
Neutro Abs: 4350 cells/uL (ref 1500–7800)
Neutrophils Relative %: 58 %
Platelets: 203 10*3/uL (ref 140–400)
RBC: 4.81 10*6/uL (ref 4.20–5.80)
RDW: 12.4 % (ref 11.0–15.0)
Total Lymphocyte: 27.4 %
WBC: 7.5 10*3/uL (ref 3.8–10.8)

## 2021-05-14 LAB — COMPLETE METABOLIC PANEL WITH GFR
AG Ratio: 1.2 (calc) (ref 1.0–2.5)
ALT: 49 U/L — ABNORMAL HIGH (ref 9–46)
AST: 30 U/L (ref 10–35)
Albumin: 4.3 g/dL (ref 3.6–5.1)
Alkaline phosphatase (APISO): 62 U/L (ref 35–144)
BUN: 12 mg/dL (ref 7–25)
CO2: 27 mmol/L (ref 20–32)
Calcium: 10.3 mg/dL (ref 8.6–10.3)
Chloride: 100 mmol/L (ref 98–110)
Creat: 0.82 mg/dL (ref 0.70–1.35)
Globulin: 3.6 g/dL (calc) (ref 1.9–3.7)
Glucose, Bld: 106 mg/dL — ABNORMAL HIGH (ref 65–99)
Potassium: 4.2 mmol/L (ref 3.5–5.3)
Sodium: 139 mmol/L (ref 135–146)
Total Bilirubin: 0.6 mg/dL (ref 0.2–1.2)
Total Protein: 7.9 g/dL (ref 6.1–8.1)
eGFR: 96 mL/min/{1.73_m2} (ref >=60)

## 2021-05-14 LAB — LIPID PANEL
Cholesterol: 249 mg/dL — ABNORMAL HIGH (ref <200)
HDL: 50 mg/dL (ref >=40)
LDL Cholesterol (Calc): 153 mg/dL (calc) — ABNORMAL HIGH
Non-HDL Cholesterol (Calc): 199 mg/dL (calc) — ABNORMAL HIGH (ref <130)
Total CHOL/HDL Ratio: 5 (calc) — ABNORMAL HIGH (ref <5.0)
Triglycerides: 278 mg/dL — ABNORMAL HIGH (ref <150)

## 2021-05-26 DIAGNOSIS — Z1212 Encounter for screening for malignant neoplasm of rectum: Secondary | ICD-10-CM | POA: Diagnosis not present

## 2021-05-26 DIAGNOSIS — Z1211 Encounter for screening for malignant neoplasm of colon: Secondary | ICD-10-CM | POA: Diagnosis not present

## 2021-05-30 DIAGNOSIS — Z0100 Encounter for examination of eyes and vision without abnormal findings: Secondary | ICD-10-CM | POA: Diagnosis not present

## 2021-05-30 DIAGNOSIS — H5200 Hypermetropia, unspecified eye: Secondary | ICD-10-CM | POA: Diagnosis not present

## 2021-06-03 LAB — COLOGUARD: COLOGUARD: NEGATIVE

## 2021-07-16 ENCOUNTER — Emergency Department (HOSPITAL_BASED_OUTPATIENT_CLINIC_OR_DEPARTMENT_OTHER): Payer: Medicare HMO | Admitting: Radiology

## 2021-07-16 ENCOUNTER — Emergency Department (HOSPITAL_BASED_OUTPATIENT_CLINIC_OR_DEPARTMENT_OTHER)
Admission: EM | Admit: 2021-07-16 | Discharge: 2021-07-17 | Disposition: A | Discharge status: Home / Self Care | Payer: Medicare HMO | Attending: Emergency Medicine | Admitting: Emergency Medicine

## 2021-07-16 ENCOUNTER — Encounter (HOSPITAL_BASED_OUTPATIENT_CLINIC_OR_DEPARTMENT_OTHER): Payer: Self-pay

## 2021-07-16 ENCOUNTER — Other Ambulatory Visit: Payer: Self-pay

## 2021-07-16 DIAGNOSIS — Z87891 Personal history of nicotine dependence: Secondary | ICD-10-CM | POA: Diagnosis not present

## 2021-07-16 DIAGNOSIS — R0789 Other chest pain: Secondary | ICD-10-CM | POA: Diagnosis not present

## 2021-07-16 DIAGNOSIS — M545 Low back pain, unspecified: Secondary | ICD-10-CM | POA: Diagnosis not present

## 2021-07-16 DIAGNOSIS — E876 Hypokalemia: Secondary | ICD-10-CM | POA: Insufficient documentation

## 2021-07-16 DIAGNOSIS — I10 Essential (primary) hypertension: Secondary | ICD-10-CM | POA: Insufficient documentation

## 2021-07-16 DIAGNOSIS — Z79899 Other long term (current) drug therapy: Secondary | ICD-10-CM | POA: Insufficient documentation

## 2021-07-16 NOTE — ED Triage Notes (Signed)
Back and chest soreness for 5 days after doing fence work.

## 2021-07-16 NOTE — ED Provider Notes (Signed)
[facility] Provider Note: [name], MD, FACEP  CSN: [id number] MRN: [id number] ARRIVAL: 07/16/21 at 2051 ROOM: DB002/DB002   CHIEF COMPLAINT  Back Pain   HISTORY OF PRESENT ILLNESS  07/16/21 11:37 PM Patrick Nielsen is a 71 y.o. male who is working on a fence about 5 days ago.  He did not have a specific injury.  The next morning when he woke up he was having pain in his chest, abdomen and lower back.  The pain in his chest was fairly severe at that time.  The pain is located diffusely in the upper chest and he has difficulty characterizing it.  He has had no associated shortness of breath, nausea, vomiting or diaphoresis.  Nothing makes the pain in the chest better or worse.  Over the subsequent several days the chest pain has nearly resolved and he now rates it about a 2 out of 10 and his abdominal pain is no longer present.  His principal pain is now in his lower back, which is minimal when lying supine but severe when sitting in a chair.  He denies any numbness or weakness in his legs.  He denies any bowel or bladder changes.   Past Medical History:  Diagnosis Date   Essential hypertension, benign 03/02/2019   HLD (hyperlipidemia) 03/02/2019   Vitamin D deficiency disease 03/02/2019    History reviewed. No pertinent surgical history.  Family History  Problem Relation Age of Onset   Emphysema Mother    Alcohol abuse Father    Emphysema Brother    Healthy Son    Healthy Daughter    Healthy Daughter     Social History   Tobacco Use   Smoking status: Former    Packs/day: 1.50    Years: 40.00    Pack years: 60.00    Types: Cigarettes    Quit date: 05/26/1996    Years since quitting: 25.1   Smokeless tobacco: Never  Vaping Use   Vaping Use: Never used  Substance Use Topics   Alcohol use: Yes    Alcohol/week: 42.0 standard drinks    Types: 42 Cans of beer per week   Drug use: No    Prior to Admission medications   Medication Sig Start Date End Date Taking?  Authorizing Provider  HYDROcodone-acetaminophen (NORCO) 5-325 MG tablet Take 1 tablet by mouth every 6 (six) hours as needed for severe pain (may cause constipation). 07/17/21  Yes Nashley Cordoba, MD  potassium chloride SA (KLOR-CON M) 20 MEQ tablet Take 1 tablet (20 mEq total) by mouth 2 (two) times daily. 07/17/21  Yes Elicia Lui, MD  Cholecalciferol (VITAMIN D3) 125 MCG (5000 UT) TABS Take 1 tablet by mouth daily.    [provider]  fluticasone (FLONASE) 50 MCG/ACT nasal spray Place 1 spray into both nostrils daily. 10/16/20   Elenore Paddy, NP  hydrochlorothiazide (HYDRODIURIL) 25 MG tablet Take 1 tablet (25 mg total) by mouth daily. 02/11/21   [name], NP  losartan (COZAAR) 100 MG tablet Take 1 tablet (100 mg total) by mouth daily. 02/11/21   [name], NP  Omega-3 Fatty Acids (FISH OIL PO) Take 1 capsule by mouth daily.    [provider]    Allergies Patient has no known allergies.   REVIEW OF SYSTEMS  Negative except as noted here or in the History of Present Illness.   PHYSICAL EXAMINATION  Initial Vital Signs Blood pressure (!) 150/63, pulse (!) 102, temperature 99.7 F (37.6  C), temperature source Oral, resp. rate 18, height 5\' 6"  (1.676 m), weight 95.3 kg.  Examination General: Well-developed, well-nourished male in no acute distress; appearance consistent with age of record HENT: normocephalic; atraumatic Eyes: pupils equal, round and reactive to light; extraocular muscles intact Neck: supple Heart: regular rate and rhythm Lungs: clear to auscultation bilaterally Chest: Nontender Abdomen: soft; nondistended; nontender; bowel sounds present Back: No lumbar tenderness Extremities: No deformity; full range of motion Neurologic: Awake, alert and oriented; motor function intact in all extremities and symmetric; no facial droop Skin: Warm and dry Psychiatric: Normal mood and affect   RESULTS  Summary of this visit's results, reviewed  and interpreted by myself:   EKG Interpretation  Date/Time:  Tuesday July 16 2021 21:04:34 EST Ventricular Rate:  106 PR Interval:  152 QRS Duration: 94 QT Interval:  346 QTC Calculation: 459 R Axis:   -17 Text Interpretation: Sinus tachycardia with occasional Premature ventricular complexes Inferior infarct , age undetermined Abnormal ECG No previous ECGs available Confirmed by Paula Libra ([id number]) on 07/16/2021 10:47:10 PM       Laboratory Studies: Results for orders placed or performed during the hospital encounter of 07/16/21 (from the past 24 hour(s))  CBC with Differential/Platelet     Status: Abnormal   Collection Time: 07/17/21 12:20 AM  Result Value Ref Range   WBC 16.2 (H) 4.0 - 10.5 K/uL   RBC 4.06 (L) 4.22 - 5.81 MIL/uL   Hemoglobin 12.8 (L) 13.0 - 17.0 g/dL   HCT 26.4 (L) 15.8 - 30.9 %   MCV 90.4 80.0 - 100.0 fL   MCH 31.5 26.0 - 34.0 pg   MCHC 34.9 30.0 - 36.0 g/dL   RDW 40.7 68.0 - 88.1 %   Platelets 202 150 - 400 K/uL   nRBC 0.0 0.0 - 0.2 %   Neutrophils Relative % 74 %   Neutro Abs 12.0 (H) 1.7 - 7.7 K/uL   Lymphocytes Relative 12 %   Lymphs Abs 1.9 0.7 - 4.0 K/uL   Monocytes Relative 13 %   Monocytes Absolute 2.1 (H) 0.1 - 1.0 K/uL   Eosinophils Relative 0 %   Eosinophils Absolute 0.0 0.0 - 0.5 K/uL   Basophils Relative 0 %   Basophils Absolute 0.0 0.0 - 0.1 K/uL   Immature Granulocytes 1 %   Abs Immature Granulocytes 0.10 (H) 0.00 - 0.07 K/uL  Basic metabolic panel     Status: Abnormal   Collection Time: 07/17/21 12:20 AM  Result Value Ref Range   Sodium 132 (L) 135 - 145 mmol/L   Potassium 2.9 (L) 3.5 - 5.1 mmol/L   Chloride 96 (L) 98 - 111 mmol/L   CO2 25 22 - 32 mmol/L   Glucose, Bld 118 (H) 70 - 99 mg/dL   BUN 29 (H) 8 - 23 mg/dL   Creatinine, Ser 1.03 0.61 - 1.24 mg/dL   Calcium 9.3 8.9 - 15.9 mg/dL   GFR, Estimated >45 >85 mL/min   Anion gap 11 5 - 15  Troponin I (High Sensitivity)     Status: None   Collection Time: 07/17/21 12:20  AM  Result Value Ref Range   Troponin I (High Sensitivity) 3 <18 ng/L   Imaging Studies: DG Chest 2 View  Result Date: 07/17/2021 CLINICAL DATA:  Chest and back pain for several days, initial encounter EXAM: CHEST - 2 VIEW COMPARISON:  None. FINDINGS: Cardiac shadow is within normal limits. Aortic calcifications are noted. The lungs are clear bilaterally. Degenerative changes of  the thoracic spine are noted. IMPRESSION: No active cardiopulmonary disease. Electronically Signed   By: Alcide Clever M.D.   On: 07/17/2021 00:14   DG Lumbar Spine Complete  Result Date: 07/17/2021 CLINICAL DATA:  Back pain for several days EXAM: LUMBAR SPINE - COMPLETE 4+ VIEW COMPARISON:  None. FINDINGS: Five non rib-bearing lumbar vertebra are noted. No pars defects are seen. No anterolisthesis is noted. Disc space narrowing is noted at L4-5 and L5-S1. Osteophytic changes are noted. Diffuse aortic calcifications are seen. IMPRESSION: Degenerative changes without acute abnormality. Electronically Signed   By: Alcide Clever M.D.   On: 07/17/2021 00:17    ED COURSE and MDM  Nursing notes, initial and subsequent vitals signs, including pulse oximetry, reviewed and interpreted by myself.  Vitals:   07/16/21 2100 07/16/21 2101  BP: (!) 150/63   Pulse: (!) 102   Resp: 18   Temp: 99.7 F (37.6 C)   TempSrc: Oral   Weight:  95.3 kg  Height:  5\' 6"  (1.676 m)   Medications  potassium chloride SA (KLOR-CON M) CR tablet 40 mEq (40 mEq Oral Given 07/17/21 0137)   1:40 AM Patient's troponin is 7 (normal) and he has no acute changes on his EKG.  The nature of his chest pain is inconsistent with acute coronary syndrome and I suspect it is more likely due to musculoskeletal strain from his recent fence building.  This is also the likely cause of his low back pain.  We would normally treat his low back pain with an NSAID but his creatinine has increased from 0.8 to on 05/13/2021 to 1.13 and wished to avoid further kidney damage.   His potassium is 2.9, likely due to hydrochlorothiazide use, and we will start repletion.  He was advised of this and the need to follow-up with his primary care physician.  We will treat his back pain with a brief course of narcotic pain medication.   PROCEDURES  Procedures   ED DIAGNOSES     ICD-10-CM   1. Acute midline low back pain without sciatica  M54.50     2. Atypical chest pain  R07.89     3. Hypokalemia  E87.6          Jayr Lupercio, 05/15/2021, MD 07/17/21 [phone]

## 2021-07-17 ENCOUNTER — Telehealth (HOSPITAL_BASED_OUTPATIENT_CLINIC_OR_DEPARTMENT_OTHER): Payer: Self-pay | Admitting: Emergency Medicine

## 2021-07-17 DIAGNOSIS — M545 Low back pain, unspecified: Secondary | ICD-10-CM | POA: Diagnosis not present

## 2021-07-17 DIAGNOSIS — R079 Chest pain, unspecified: Secondary | ICD-10-CM | POA: Diagnosis not present

## 2021-07-17 LAB — BASIC METABOLIC PANEL
Anion gap: 11 (ref 5–15)
BUN: 29 mg/dL — ABNORMAL HIGH (ref 8–23)
CO2: 25 mmol/L (ref 22–32)
Calcium: 9.3 mg/dL (ref 8.9–10.3)
Chloride: 96 mmol/L — ABNORMAL LOW (ref 98–111)
Creatinine, Ser: 1.13 mg/dL (ref 0.61–1.24)
GFR, Estimated: >60 mL/min (ref >60)
Glucose, Bld: 118 mg/dL — ABNORMAL HIGH (ref 70–99)
Potassium: 2.9 mmol/L — ABNORMAL LOW (ref 3.5–5.1)
Sodium: 132 mmol/L — ABNORMAL LOW (ref 135–145)

## 2021-07-17 LAB — CBC WITH DIFFERENTIAL/PLATELET
Abs Immature Granulocytes: 0.1 10*3/uL — ABNORMAL HIGH (ref 0.00–0.07)
Basophils Absolute: 0 10*3/uL (ref 0.0–0.1)
Basophils Relative: 0 %
Eosinophils Absolute: 0 10*3/uL (ref 0.0–0.5)
Eosinophils Relative: 0 %
HCT: 36.7 % — ABNORMAL LOW (ref 39.0–52.0)
Hemoglobin: 12.8 g/dL — ABNORMAL LOW (ref 13.0–17.0)
Immature Granulocytes: 1 %
Lymphocytes Relative: 12 %
Lymphs Abs: 1.9 10*3/uL (ref 0.7–4.0)
MCH: 31.5 pg (ref 26.0–34.0)
MCHC: 34.9 g/dL (ref 30.0–36.0)
MCV: 90.4 fL (ref 80.0–100.0)
Monocytes Absolute: 2.1 10*3/uL — ABNORMAL HIGH (ref 0.1–1.0)
Monocytes Relative: 13 %
Neutro Abs: 12 10*3/uL — ABNORMAL HIGH (ref 1.7–7.7)
Neutrophils Relative %: 74 %
Platelets: 202 10*3/uL (ref 150–400)
RBC: 4.06 MIL/uL — ABNORMAL LOW (ref 4.22–5.81)
RDW: 12.2 % (ref 11.5–15.5)
WBC: 16.2 10*3/uL — ABNORMAL HIGH (ref 4.0–10.5)
nRBC: 0 % (ref 0.0–0.2)

## 2021-07-17 LAB — TROPONIN I (HIGH SENSITIVITY): Troponin I (High Sensitivity): 3 ng/L (ref <18)

## 2021-07-17 MED ORDER — HYDROCODONE-ACETAMINOPHEN 5-325 MG PO TABS: 1.0000 | ORAL_TABLET | Freq: Four times a day (QID) | ORAL | 0 refills | Status: DC | PRN

## 2021-07-17 MED ORDER — HYDROCODONE-ACETAMINOPHEN 5-325 MG PO TABS
1.0000 | ORAL_TABLET | Freq: Once | ORAL | Status: AC
  Administered 2021-07-17: 1 via ORAL
  Filled 2021-07-17: qty 1

## 2021-07-17 MED ORDER — POTASSIUM CHLORIDE CRYS ER 20 MEQ PO TBCR
40.0000 meq | EXTENDED_RELEASE_TABLET | Freq: Once | ORAL | Status: AC
Start: 2021-07-17 — End: 2021-07-17
  Administered 2021-07-17: 40 meq via ORAL
  Filled 2021-07-17: qty 2

## 2021-07-17 MED ORDER — POTASSIUM CHLORIDE CRYS ER 20 MEQ PO TBCR: 20.0000 meq | EXTENDED_RELEASE_TABLET | Freq: Two times a day (BID) | ORAL | 0 refills | Status: DC

## 2021-07-17 NOTE — ED Provider Notes (Incomplete)
[facility] Provider Note: Patrick Dell, MD, FACEP  CSN: [id number] MRN: [id number] ARRIVAL: 07/16/21 at 2051 ROOM: DB002/DB002   CHIEF COMPLAINT  Back Pain   HISTORY OF PRESENT ILLNESS  07/16/21 11:37 PM Patrick Nielsen is a 71 y.o. male who is working on a fence about 5 days ago.  He did not have a specific injury.  The next morning when he woke up he was having pain in his chest, abdomen and lower back.  The pain in his chest was fairly severe at that time.  The pain is located diffusely in the upper chest and he has difficulty characterizing it.  He has had no associated shortness of breath, nausea, vomiting or diaphoresis.  Nothing makes the pain in the chest better or worse.  Over the subsequent several days the chest pain has nearly resolved and he now rates it about a 2 out of 10 and his abdominal pain is no longer present.  His principal pain is now in his lower back, which is minimal when lying supine but severe when sitting in a chair.  He denies any numbness or weakness in his legs.  He denies any bowel or bladder changes.   Past Medical History:  Diagnosis Date   Essential hypertension, benign 03/02/2019   HLD (hyperlipidemia) 03/02/2019   Vitamin D deficiency disease 03/02/2019    History reviewed. No pertinent surgical history.  Family History  Problem Relation Age of Onset   Emphysema Mother    Alcohol abuse Father    Emphysema Brother    Healthy Son    Healthy Daughter    Healthy Daughter     Social History   Tobacco Use   Smoking status: Former    Packs/day: 1.50    Years: 40.00    Pack years: 60.00    Types: Cigarettes    Quit date: 05/26/1996    Years since quitting: 25.1   Smokeless tobacco: Never  Vaping Use   Vaping Use: Never used  Substance Use Topics   Alcohol use: Yes    Alcohol/week: 42.0 standard drinks    Types: 42 Cans of beer per week   Drug use: No    Prior to Admission medications   Medication Sig Start Date End  Date Taking? Authorizing Provider  Cholecalciferol (VITAMIN D3) 125 MCG (5000 UT) TABS Take 1 tablet by mouth daily.    [provider]  fluticasone (FLONASE) 50 MCG/ACT nasal spray Place 1 spray into both nostrils daily. 10/16/20   Elenore Paddy, NP  hydrochlorothiazide (HYDRODIURIL) 25 MG tablet Take 1 tablet (25 mg total) by mouth daily. 02/11/21   [name], NP  losartan (COZAAR) 100 MG tablet Take 1 tablet (100 mg total) by mouth daily. 02/11/21   [name], NP  Omega-3 Fatty Acids (FISH OIL PO) Take 1 capsule by mouth daily.    [provider]    Allergies Patient has no known allergies.   REVIEW OF SYSTEMS  Negative except as noted here or in the History of Present Illness.   PHYSICAL EXAMINATION  Initial Vital Signs Blood pressure (!) 150/63, pulse (!) 102, temperature 99.7 F (37.6 C), temperature source Oral, resp. rate 18, height 5\' 6"  (1.676 m), weight 95.3 kg.  Examination General: Well-developed, well-nourished male in no acute distress; appearance consistent with age of record HENT: normocephalic; atraumatic Eyes: pupils equal, round and reactive to light; extraocular muscles intact Neck: supple Heart: regular rate and rhythm Lungs: clear to auscultation  bilaterally Chest: Nontender Abdomen: soft; nondistended; nontender; bowel sounds present Back: No lumbar tenderness Extremities: No deformity; full range of motion Neurologic: Awake, alert and oriented; motor function intact in all extremities and symmetric; no facial droop Skin: Warm and dry Psychiatric: Normal mood and affect   RESULTS  Summary of this visit's results, reviewed and interpreted by myself:   EKG Interpretation  Date/Time:  Tuesday July 16 2021 21:04:34 EST Ventricular Rate:  106 PR Interval:  152 QRS Duration: 94 QT Interval:  346 QTC Calculation: 459 R Axis:   -17 Text Interpretation: Sinus tachycardia with occasional Premature ventricular  complexes Inferior infarct , age undetermined Abnormal ECG No previous ECGs available Confirmed by Paula Libra ([id number]) on 07/16/2021 10:47:10 PM       Laboratory Studies: Results for orders placed or performed during the hospital encounter of 07/16/21 (from the past 24 hour(s))  CBC with Differential/Platelet     Status: Abnormal   Collection Time: 07/17/21 12:20 AM  Result Value Ref Range   WBC 16.2 (H) 4.0 - 10.5 K/uL   RBC 4.06 (L) 4.22 - 5.81 MIL/uL   Hemoglobin 12.8 (L) 13.0 - 17.0 g/dL   HCT 19.1 (L) 47.8 - 29.5 %   MCV 90.4 80.0 - 100.0 fL   MCH 31.5 26.0 - 34.0 pg   MCHC 34.9 30.0 - 36.0 g/dL   RDW 62.1 30.8 - 65.7 %   Platelets 202 150 - 400 K/uL   nRBC 0.0 0.0 - 0.2 %   Neutrophils Relative % 74 %   Neutro Abs 12.0 (H) 1.7 - 7.7 K/uL   Lymphocytes Relative 12 %   Lymphs Abs 1.9 0.7 - 4.0 K/uL   Monocytes Relative 13 %   Monocytes Absolute 2.1 (H) 0.1 - 1.0 K/uL   Eosinophils Relative 0 %   Eosinophils Absolute 0.0 0.0 - 0.5 K/uL   Basophils Relative 0 %   Basophils Absolute 0.0 0.0 - 0.1 K/uL   Immature Granulocytes 1 %   Abs Immature Granulocytes 0.10 (H) 0.00 - 0.07 K/uL  Basic metabolic panel     Status: Abnormal   Collection Time: 07/17/21 12:20 AM  Result Value Ref Range   Sodium 132 (L) 135 - 145 mmol/L   Potassium 2.9 (L) 3.5 - 5.1 mmol/L   Chloride 96 (L) 98 - 111 mmol/L   CO2 25 22 - 32 mmol/L   Glucose, Bld 118 (H) 70 - 99 mg/dL   BUN 29 (H) 8 - 23 mg/dL   Creatinine, Ser 8.46 0.61 - 1.24 mg/dL   Calcium 9.3 8.9 - 96.2 mg/dL   GFR, Estimated >95 >28 mL/min   Anion gap 11 5 - 15  Troponin I (High Sensitivity)     Status: None   Collection Time: 07/17/21 12:20 AM  Result Value Ref Range   Troponin I (High Sensitivity) 3 <18 ng/L   Imaging Studies: DG Chest 2 View  Result Date: 07/17/2021 CLINICAL DATA:  Chest and back pain for several days, initial encounter EXAM: CHEST - 2 VIEW COMPARISON:  None. FINDINGS: Cardiac shadow is within normal limits.  Aortic calcifications are noted. The lungs are clear bilaterally. Degenerative changes of the thoracic spine are noted. IMPRESSION: No active cardiopulmonary disease. Electronically Signed   By: Alcide Clever M.D.   On: 07/17/2021 00:14   DG Lumbar Spine Complete  Result Date: 07/17/2021 CLINICAL DATA:  Back pain for several days EXAM: LUMBAR SPINE - COMPLETE 4+ VIEW COMPARISON:  None. FINDINGS: Five non rib-bearing  lumbar vertebra are noted. No pars defects are seen. No anterolisthesis is noted. Disc space narrowing is noted at L4-5 and L5-S1. Osteophytic changes are noted. Diffuse aortic calcifications are seen. IMPRESSION: Degenerative changes without acute abnormality. Electronically Signed   By: Alcide Clever M.D.   On: 07/17/2021 00:17    ED COURSE and MDM  Nursing notes, initial and subsequent vitals signs, including pulse oximetry, reviewed and interpreted by myself.  Vitals:   07/16/21 2100 07/16/21 2101  BP: (!) 150/63   Pulse: (!) 102   Resp: 18   Temp: 99.7 F (37.6 C)   TempSrc: Oral   Weight:  95.3 kg  Height:  5\' 6"  (1.676 m)   Medications - No data to display    PROCEDURES  Procedures   ED DIAGNOSES  No diagnosis found.

## 2021-07-17 NOTE — Telephone Encounter (Signed)
Patient seen by Dr. Read Drivers last evening.  Patient called stating that he could not pick up his prescription at [facility].  New Hydrocodone prescription sent to the [facility] in [place] as [facility] on [facility] back order

## 2021-10-04 ENCOUNTER — Other Ambulatory Visit: Payer: Self-pay | Admitting: Nurse Practitioner

## 2021-10-04 DIAGNOSIS — I10 Essential (primary) hypertension: Secondary | ICD-10-CM

## 2021-10-16 ENCOUNTER — Encounter: Payer: Medicare HMO | Admitting: Nurse Practitioner

## 2021-10-17 ENCOUNTER — Encounter: Payer: Medicare HMO | Admitting: Nurse Practitioner

## 2021-10-23 ENCOUNTER — Encounter (INDEPENDENT_AMBULATORY_CARE_PROVIDER_SITE_OTHER): Payer: Medicare HMO | Admitting: Nurse Practitioner

## 2021-10-25 ENCOUNTER — Ambulatory Visit (INDEPENDENT_AMBULATORY_CARE_PROVIDER_SITE_OTHER): Payer: Medicare HMO | Admitting: Nurse Practitioner

## 2021-10-25 ENCOUNTER — Encounter: Payer: Self-pay | Admitting: Nurse Practitioner

## 2021-10-25 VITALS — BP 150/82 | HR 78 | Temp 97.5°F | Ht 66.0 in | Wt 214.0 lb

## 2021-10-25 DIAGNOSIS — I10 Essential (primary) hypertension: Secondary | ICD-10-CM | POA: Diagnosis not present

## 2021-10-25 DIAGNOSIS — E782 Mixed hyperlipidemia: Secondary | ICD-10-CM

## 2021-10-25 DIAGNOSIS — R739 Hyperglycemia, unspecified: Secondary | ICD-10-CM | POA: Diagnosis not present

## 2021-10-25 DIAGNOSIS — Z0000 Encounter for general adult medical examination without abnormal findings: Secondary | ICD-10-CM | POA: Diagnosis not present

## 2021-10-25 NOTE — Patient Instructions (Signed)
Patrick Nielsen , Thank you for taking time to come for your Medicare Wellness Visit. I appreciate your ongoing commitment to your health goals. Please review the following plan we discussed and let me know if I can assist you in the future.   Screening recommendations/referrals: Colonoscopy- cologuard completed 2023-due every 3 years  Recommended yearly ophthalmology/optometry visit for glaucoma screening and checkup Recommended yearly dental visit for hygiene and checkup  Vaccinations: Influenza vaccine up to date Pneumococcal vaccine up to date Tdap vaccine up to date Shingles vaccine DUE- recommend to get at your local pharmacy       Advanced directives: -recommend to complete and bring back to office to place on file.    Conditions/risks identified: obesity, hypertension, hyperlipidemia, age  Next appointment: yearly   Preventive Care 69 Years and Older, Male Preventive care refers to lifestyle choices and visits with your health care provider that can promote health and wellness. What does preventive care include? A yearly physical exam. This is also called an annual well check. Dental exams once or twice a year. Routine eye exams. Ask your health care provider how often you should have your eyes checked. Personal lifestyle choices, including: Daily care of your teeth and gums. Regular physical activity. Eating a healthy diet. Avoiding tobacco and drug use. Limiting alcohol use. Practicing safe sex. Taking low doses of aspirin every day. Taking vitamin and mineral supplements as recommended by your health care provider. What happens during an annual well check? The services and screenings done by your health care provider during your annual well check will depend on your age, overall health, lifestyle risk factors, and family history of disease. Counseling  Your health care provider may ask you questions about your: Alcohol use. Tobacco use. Drug use. Emotional  well-being. Home and relationship well-being. Sexual activity. Eating habits. History of falls. Memory and ability to understand (cognition). Work and work Astronomer. Screening  You may have the following tests or measurements: Height, weight, and BMI. Blood pressure. Lipid and cholesterol levels. These may be checked every 5 years, or more frequently if you are over 43 years old. Skin check. Lung cancer screening. You may have this screening every year starting at age 69 if you have a 30-pack-year history of smoking and currently smoke or have quit within the past 15 years. Fecal occult blood test (FOBT) of the stool. You may have this test every year starting at age 69. Flexible sigmoidoscopy or colonoscopy. You may have a sigmoidoscopy every 5 years or a colonoscopy every 10 years starting at age 69. Prostate cancer screening. Recommendations will vary depending on your family history and other risks. Hepatitis C blood test. Hepatitis B blood test. Sexually transmitted disease (STD) testing. Diabetes screening. This is done by checking your blood sugar (glucose) after you have not eaten for a while (fasting). You may have this done every 1-3 years. Abdominal aortic aneurysm (AAA) screening. You may need this if you are a current or former smoker. Osteoporosis. You may be screened starting at age 69 if you are at high risk. Talk with your health care provider about your test results, treatment options, and if necessary, the need for more tests. Vaccines  Your health care provider may recommend certain vaccines, such as: Influenza vaccine. This is recommended every year. Tetanus, diphtheria, and acellular pertussis (Tdap, Td) vaccine. You may need a Td booster every 10 years. Zoster vaccine. You may need this after age 69. Pneumococcal 13-valent conjugate (PCV13) vaccine. One dose is  recommended after age 22. Pneumococcal polysaccharide (PPSV23) vaccine. One dose is recommended after  age 69. Talk to your health care provider about which screenings and vaccines you need and how often you need them. This information is not intended to replace advice given to you by your health care provider. Make sure you discuss any questions you have with your health care provider. Document Released: 06/08/2015 Document Revised: 01/30/2016 Document Reviewed: 03/13/2015 Elsevier Interactive Patient Education  2017 [place] Prevention in the Home Falls can cause injuries. They can happen to people of all ages. There are many things you can do to make your home safe and to help prevent falls. What can I do on the outside of my home? Regularly fix the edges of walkways and driveways and fix any cracks. Remove anything that might make you trip as you walk through a door, such as a raised step or threshold. Trim any bushes or trees on the path to your home. Use bright outdoor lighting. Clear any walking paths of anything that might make someone trip, such as rocks or tools. Regularly check to see if handrails are loose or broken. Make sure that both sides of any steps have handrails. Any raised decks and porches should have guardrails on the edges. Have any leaves, snow, or ice cleared regularly. Use sand or salt on walking paths during winter. Clean up any spills in your garage right away. This includes oil or grease spills. What can I do in the bathroom? Use night lights. Install grab bars by the toilet and in the tub and shower. Do not use towel bars as grab bars. Use non-skid mats or decals in the tub or shower. If you need to sit down in the shower, use a plastic, non-slip stool. Keep the floor dry. Clean up any water that spills on the floor as soon as it happens. Remove soap buildup in the tub or shower regularly. Attach bath mats securely with double-sided non-slip rug tape. Do not have throw rugs and other things on the floor that can make you trip. What can I do in the  bedroom? Use night lights. Make sure that you have a light by your bed that is easy to reach. Do not use any sheets or blankets that are too big for your bed. They should not hang down onto the floor. Have a firm chair that has side arms. You can use this for support while you get dressed. Do not have throw rugs and other things on the floor that can make you trip. What can I do in the kitchen? Clean up any spills right away. Avoid walking on wet floors. Keep items that you use a lot in easy-to-reach places. If you need to reach something above you, use a strong step stool that has a grab bar. Keep electrical cords out of the way. Do not use floor polish or wax that makes floors slippery. If you must use wax, use non-skid floor wax. Do not have throw rugs and other things on the floor that can make you trip. What can I do with my stairs? Do not leave any items on the stairs. Make sure that there are handrails on both sides of the stairs and use them. Fix handrails that are broken or loose. Make sure that handrails are as long as the stairways. Check any carpeting to make sure that it is firmly attached to the stairs. Fix any carpet that is loose or worn. Avoid having  throw rugs at the top or bottom of the stairs. If you do have throw rugs, attach them to the floor with carpet tape. Make sure that you have a light switch at the top of the stairs and the bottom of the stairs. If you do not have them, ask someone to add them for you. What else can I do to help prevent falls? Wear shoes that: Do not have high heels. Have rubber bottoms. Are comfortable and fit you well. Are closed at the toe. Do not wear sandals. If you use a stepladder: Make sure that it is fully opened. Do not climb a closed stepladder. Make sure that both sides of the stepladder are locked into place. Ask someone to hold it for you, if possible. Clearly mark and make sure that you can see: Any grab bars or  handrails. First and last steps. Where the edge of each step is. Use tools that help you move around (mobility aids) if they are needed. These include: Canes. Walkers. Scooters. Crutches. Turn on the lights when you go into a dark area. Replace any light bulbs as soon as they burn out. Set up your furniture so you have a clear path. Avoid moving your furniture around. If any of your floors are uneven, fix them. If there are any pets around you, be aware of where they are. Review your medicines with your doctor. Some medicines can make you feel dizzy. This can increase your chance of falling. Ask your doctor what other things that you can do to help prevent falls. This information is not intended to replace advice given to you by your health care provider. Make sure you discuss any questions you have with your health care provider. Document Released: 03/08/2009 Document Revised: 10/18/2015 Document Reviewed: 06/16/2014 Elsevier Interactive Patient Education  2017 [employer].

## 2021-10-25 NOTE — Progress Notes (Signed)
Subjective:   Patrick Nielsen is a 71 y.o. male who presents for Medicare Annual/Subsequent preventive examination at [facility] senior care.   Review of Systems     Cardiac Risk Factors include: advanced age (>35men, >68 women);male gender;hypertension;dyslipidemia;obesity (BMI >30kg/m2)     Objective:    Today's Vitals   10/25/21 0940  BP: (!) 150/82  Pulse: 78  Temp: (!) 97.5 F (36.4 C)  TempSrc: Temporal  SpO2: 97%  Weight: 214 lb (97.1 kg)  Height:  (1.676 m)   Body mass index is 34.54 kg/m.     10/25/2021    9:40 AM 05/13/2021    2:22 PM 10/16/2020    9:39 AM 09/05/2019    1:29 PM 04/10/2018    9:09 AM 07/02/2017    7:12 AM 04/23/2017    8:21 AM  Advanced Directives  Does Patient Have a Medical Advance Directive? No Yes No No No No No  Does patient want to make changes to medical advance directive?  Yes (MAU/Ambulatory/Procedural Areas - Information given)       Would patient like information on creating a medical advance directive? Yes (MAU/Ambulatory/Procedural Areas - Information given)  No - Patient declined Yes (ED - Information included in AVS) No - Patient declined  No - Patient declined    Current Medications (verified) Outpatient Encounter Medications as of 10/25/2021  Medication Sig   atorvastatin (LIPITOR) 10 MG tablet Take 10 mg by mouth daily.   Cholecalciferol (VITAMIN D3) 125 MCG (5000 UT) TABS Take 1 tablet by mouth daily.   fluticasone (FLONASE) 50 MCG/ACT nasal spray Place 1 spray into both nostrils daily.   hydrochlorothiazide (HYDRODIURIL) 25 MG tablet TAKE 1 TABLET (25 MG TOTAL) BY MOUTH DAILY.   losartan (COZAAR) 100 MG tablet TAKE 1 TABLET BY MOUTH EVERY DAY   Omega-3 Fatty Acids (FISH OIL PO) Take 1 capsule by mouth daily.   [DISCONTINUED] potassium chloride SA (KLOR-CON M) 20 MEQ tablet Take 1 tablet (20 mEq total) by mouth 2 (two) times daily.   [DISCONTINUED] HYDROcodone-acetaminophen (NORCO/VICODIN) 5-325 MG tablet Take 1 tablet by mouth  every 6 (six) hours as needed. (Patient not taking: Reported on 10/25/2021)   No facility-administered encounter medications on file as of 10/25/2021.    Allergies (verified) Patient has no known allergies.   History: Past Medical History:  Diagnosis Date   Essential hypertension, benign 03/02/2019   HLD (hyperlipidemia) 03/02/2019   Vitamin D deficiency disease 03/02/2019   History reviewed. No pertinent surgical history. Family History  Problem Relation Age of Onset   Emphysema Mother    Alcohol abuse Father    Emphysema Brother    Healthy Son    Healthy Daughter    Healthy Daughter    Social History   Socioeconomic History   Marital status: Married    Spouse name: Not on file   Number of children: Not on file   Years of education: Not on file   Highest education level: Not on file  Occupational History   Not on file  Tobacco Use   Smoking status: Former    Packs/day: 1.50    Years: 40.00    Pack years: 60.00    Types: Cigarettes    Quit date: 05/26/1996    Years since quitting: 25.4   Smokeless tobacco: Never  Vaping Use   Vaping Use: Never used  Substance and Sexual Activity   Alcohol use: Yes    Alcohol/week: 30.0 standard drinks    Types: 30  Cans of beer per week   Drug use: No   Sexual activity: Not on file  Other Topics Concern   Not on file  Social History Narrative   Diet: Chicken, Steak and vegetables      Caffeine: Coffee      Married, if yes what year: Married, 1991      Do you live in a house, apartment, assisted living, condo, trailer, ect: House      Is it one or more stories: one      How many persons live in your home? 2      Pets: No      Highest level or education completed: 11th      Current/Past profession: Personnel officer      Exercise:                  Type and how often:          Living Will: No   DNR:   POA/HPOA:      Functional Status:   Do you have difficulty bathing or dressing yourself? No   Do you have difficulty  preparing food or eating? No   Do you have difficulty managing your medications? No   Do you have difficulty managing your finances? No   Do you have difficulty affording your medications? No   Social Determinants of Corporate investment banker Strain: Not on file  Food Insecurity: Not on file  Transportation Needs: Not on file  Physical Activity: Not on file  Stress: Not on file  Social Connections: Not on file    Tobacco Counseling Counseling given: Not Answered   Clinical Intake:  Pre-visit preparation completed: Yes  Pain : No/denies pain     BMI - recorded: 34 Nutritional Status: BMI > 30  Obese Nutritional Risks: None  How often do you need to have someone help you when you read instructions, pamphlets, or other written materials from your doctor or pharmacy?: 1 - Never What is the last grade level you completed in school?: 6th grade  Diabetic?no         Activities of Daily Living    10/25/2021    9:53 AM  In your present state of health, do you have any difficulty performing the following activities:  Hearing? 0  Vision? 0  Difficulty concentrating or making decisions? 0  Walking or climbing stairs? 0  Dressing or bathing? 0  Doing errands, shopping? 0  Preparing Food and eating ? N  Using the Toilet? N  In the past six months, have you accidently leaked urine? N  Do you have problems with loss of bowel control? N  Managing your Medications? N  Managing your Finances? N  Housekeeping or managing your Housekeeping? N    Patient Care Team: [name], NP as PCP - General (Geriatric Medicine)  Indicate any recent Medical Services you may have received from other than [facility] providers in the past year (date may be approximate).     Assessment:   This is a routine wellness examination for Patrick Nielsen.  Hearing/Vision screen Hearing Screening - Comments:: No hearing issues  Vision Screening - Comments:: Last eye exam March 2023, [facility] in  [place]   Dietary issues and exercise activities discussed: Current Exercise Habits: The patient has a physically strenuous job, but has no regular exercise apart from work.   Goals Addressed   None    Depression Screen    10/25/2021    9:38  AM 05/13/2021    2:21 PM 10/16/2020    9:40 AM 01/25/2020    8:04 AM 09/05/2019    1:30 PM  PHQ 2/9 Scores  PHQ - 2 Score 0 0 0 0 0  Exception Documentation    Medical reason Medical reason    Fall Risk    10/25/2021    9:38 AM 05/13/2021    2:21 PM 02/11/2021    1:28 PM 10/16/2020    9:40 AM 01/25/2020    8:04 AM  Fall Risk   Falls in the past year? 1 0 0 0 0  Number falls in past yr: 0 0 0 0 0  Injury with Fall? 0 0 0 0 0  Risk for fall due to : No Fall Risks No Fall Risks No Fall Risks  No Fall Risks  Follow up Falls evaluation completed Falls evaluation completed Falls evaluation completed Falls evaluation completed Falls evaluation completed    FALL RISK PREVENTION PERTAINING TO THE HOME:  Any stairs in or around the home? Yes  If so, are there any without handrails? No  Home free of loose throw rugs in walkways, pet beds, electrical cords, etc? Yes  Adequate lighting in your home to reduce risk of falls? Yes   ASSISTIVE DEVICES UTILIZED TO PREVENT FALLS:  Life alert? No  Use of a cane, walker or w/c? No  Grab bars in the bathroom? No  Shower chair or bench in shower? No  Elevated toilet seat or a handicapped toilet? No   TIMED UP AND GO:  Was the test performed? No .   Gait steady and fast without use of assistive device  Cognitive Function:    10/25/2021    9:47 AM  MMSE - Mini Mental State Exam  Orientation to time 3  Orientation to Place 4  Registration 3  Attention/ Calculation 0  Attention/Calculation-comments Patient states he can not spell  Recall 3  Language- name 2 objects 2  Language- repeat 1  Language- follow 3 step command 3  Language- read & follow direction 1  Write a sentence 0  Copy design 1   Total score 21        10/16/2020    9:41 AM 09/05/2019    1:31 PM  6CIT Screen  What Year? 0 points 0 points  What month? 0 points 0 points  What time? 0 points 0 points  Count back from 20 0 points 0 points  Months in reverse 4 points 4 points  Repeat phrase 2 points 10 points  Total Score 6 points 14 points    Immunizations Immunization History  Administered Date(s) Administered   Fluad Quad(high Dose 65+) 02/27/2020, 02/11/2021   Influenza-Unspecified 03/02/2019   Pneumococcal Conjugate-13 08/05/2018   Pneumococcal Polysaccharide-23 09/05/2019   Tdap 03/02/2019   Unspecified SARS-COV-2 Vaccination 05/26/2020, 06/26/2020    TDAP status: Up to date  Flu Vaccine status: Up to date  Pneumococcal vaccine status: Up to date  Covid-19 vaccine status: Information provided on how to obtain vaccines.   Qualifies for Shingles Vaccine? Yes   Zostavax completed No   Shingrix Completed?: No.    Education has been provided regarding the importance of this vaccine. Patient has been advised to call insurance company to determine out of pocket expense if they have not yet received this vaccine. Advised may also receive vaccine at local pharmacy or Health Dept. Verbalized acceptance and understanding.  Screening Tests Health Maintenance  Topic Date Due   Zoster Vaccines- Shingrix (  1 of 2) Never done   COVID-19 Vaccine (3 - Booster) 08/21/2020   INFLUENZA VACCINE  12/24/2021   Fecal DNA (Cologuard)  05/26/2024   TETANUS/TDAP  03/01/2029   Pneumonia Vaccine 865+ Years old  Completed   Hepatitis C Screening  Completed   HPV VACCINES  Aged Out    Health Maintenance  Health Maintenance Due  Topic Date Due   Zoster Vaccines- Shingrix (1 of 2) Never done   COVID-19 Vaccine (3 - Booster) 08/21/2020    Colorectal cancer screening: Type of screening: Cologuard. Completed 05/2021. Repeat every 3 years  Lung Cancer Screening: (Low Dose CT Chest recommended if Age 47-80 years, 30  pack-year currently smoking OR have quit w/in 15years.) does not qualify.   Lung Cancer Screening Referral: na  Additional Screening:  Hepatitis C Screening: does qualify; Completed 2020  Vision Screening: Recommended annual ophthalmology exams for early detection of glaucoma and other disorders of the eye. Is the patient up to date with their annual eye exam?  Yes  Who is the provider or what is the name of the office in which the patient attends annual eye exams? [facility] If pt is not established with a provider, would they like to be referred to a provider to establish care? No .   Dental Screening: Recommended annual dental exams for proper oral hygiene  Community Resource Referral / Chronic Care Management: CRR required this visit?  No   CCM required this visit?  No      Plan:     I have personally reviewed and noted the following in the patient's chart:   Medical and social history Use of alcohol, tobacco or illicit drugs  Current medications and supplements including opioid prescriptions. Patient is not currently taking opioid prescriptions. Functional ability and status Nutritional status Physical activity Advanced directives List of other physicians Hospitalizations, surgeries, and ER visits in previous 12 months Vitals Screenings to include cognitive, depression, and falls Referrals and appointments  In addition, I have reviewed and discussed with patient certain preventive protocols, quality metrics, and best practice recommendations. A written personalized care plan for preventive services as well as general preventive health recommendations were provided to patient.     Sharon SellerJessica K Chritopher Coster, NP   10/25/2021

## 2021-10-26 LAB — COMPLETE METABOLIC PANEL WITH GFR
AG Ratio: 1.3 (calc) (ref 1.0–2.5)
ALT: 36 U/L (ref 9–46)
AST: 29 U/L (ref 10–35)
Albumin: 4.3 g/dL (ref 3.6–5.1)
Alkaline phosphatase (APISO): 64 U/L (ref 35–144)
BUN: 11 mg/dL (ref 7–25)
CO2: 29 mmol/L (ref 20–32)
Calcium: 10.1 mg/dL (ref 8.6–10.3)
Chloride: 100 mmol/L (ref 98–110)
Creat: 0.75 mg/dL (ref 0.70–1.35)
Globulin: 3.3 g/dL (calc) (ref 1.9–3.7)
Glucose, Bld: 122 mg/dL (ref 65–139)
Potassium: 4.3 mmol/L (ref 3.5–5.3)
Sodium: 138 mmol/L (ref 135–146)
Total Bilirubin: 0.8 mg/dL (ref 0.2–1.2)
Total Protein: 7.6 g/dL (ref 6.1–8.1)
eGFR: 98 mL/min/{1.73_m2} (ref >=60)

## 2021-10-26 LAB — CBC WITH DIFFERENTIAL/PLATELET
Absolute Monocytes: 750 cells/uL (ref 200–950)
Basophils Absolute: 42 cells/uL (ref 0–200)
Basophils Relative: 0.7 %
Eosinophils Absolute: 132 cells/uL (ref 15–500)
Eosinophils Relative: 2.2 %
HCT: 44.3 % (ref 38.5–50.0)
Hemoglobin: 15 g/dL (ref 13.2–17.1)
Lymphs Abs: 1746 cells/uL (ref 850–3900)
MCH: 31.4 pg (ref 27.0–33.0)
MCHC: 33.9 g/dL (ref 32.0–36.0)
MCV: 92.7 fL (ref 80.0–100.0)
MPV: 11.1 fL (ref 7.5–12.5)
Monocytes Relative: 12.5 %
Neutro Abs: 3330 cells/uL (ref 1500–7800)
Neutrophils Relative %: 55.5 %
Platelets: 203 10*3/uL (ref 140–400)
RBC: 4.78 10*6/uL (ref 4.20–5.80)
RDW: 12.9 % (ref 11.0–15.0)
Total Lymphocyte: 29.1 %
WBC: 6 10*3/uL (ref 3.8–10.8)

## 2021-10-26 LAB — LIPID PANEL
Cholesterol: 179 mg/dL (ref <200)
HDL: 53 mg/dL (ref >=40)
LDL Cholesterol (Calc): 95 mg/dL (calc)
Non-HDL Cholesterol (Calc): 126 mg/dL (calc) (ref <130)
Total CHOL/HDL Ratio: 3.4 (calc) (ref <5.0)
Triglycerides: 224 mg/dL — ABNORMAL HIGH (ref <150)

## 2021-10-26 LAB — HEMOGLOBIN A1C
Hgb A1c MFr Bld: 5.6 % of total Hgb (ref <5.7)
Mean Plasma Glucose: 114 mg/dL
eAG (mmol/L): 6.3 mmol/L

## 2021-11-11 ENCOUNTER — Ambulatory Visit: Payer: Medicare HMO | Admitting: Nurse Practitioner

## 2021-11-21 NOTE — Progress Notes (Signed)
Careteam: Patient Care Team: Lauree Chandler, NP as PCP - General (Geriatric Medicine)  PLACE OF SERVICE:  [place] Directive information Does Patient Have a Medical Advance Directive?: No, Would patient like information on creating a medical advance directive?: Yes (MAU/Ambulatory/Procedural Areas - Information given) (Paperwork given at previous visit)  No Known Allergies  Chief Complaint  Patient presents with   Medical Management of Chronic Issues    6 month follow-up and discuss labs (copy printed and given to patient). Discuss need for shingrix and additional covid boosters or post pone if patient refuses. NCIR verified. Patient denies receiving any vaccines since last visit. Discuss right should and fingernails.      HPI: Patient is a 71 y.o. male for routine follow up.   He has started taking cholesterol medication again, lipitor. No side effects now LDL  at goal.   Htn- well controlled. Taking hctz and losartan.   Reports pain with right shoulder. He has a lot of pain when increasing arm, has full rom.  Does a lot of physical work.  Now pain is 6/10 when he raises arm otherwise not painful Overall getting better.   Has fungal disease to his fingernails. Was prescribed medication in the past.   Review of Systems:  Review of Systems  Constitutional:  Negative for chills, fever and weight loss.  HENT:  Negative for tinnitus.   Respiratory:  Negative for cough, sputum production and shortness of breath.   Cardiovascular:  Negative for chest pain, palpitations and leg swelling.  Gastrointestinal:  Negative for abdominal pain, constipation, diarrhea and heartburn.  Genitourinary:  Negative for dysuria, frequency and urgency.  Musculoskeletal:  Positive for joint pain. Negative for back pain, falls and myalgias.  Skin: Negative.   Neurological:  Negative for dizziness and headaches.  Psychiatric/Behavioral:  Negative for depression and memory loss. The  patient does not have insomnia.     Past Medical History:  Diagnosis Date   Essential hypertension, benign 03/02/2019   HLD (hyperlipidemia) 03/02/2019   Vitamin D deficiency disease 03/02/2019   History reviewed. No pertinent surgical history. Social History:   reports that he quit smoking about 25 years ago. His smoking use included cigarettes. He has a 60.00 pack-year smoking history. He has never used smokeless tobacco. He reports current alcohol use of about 30.0 standard drinks of alcohol per week. He reports that he does not use drugs.  Family History  Problem Relation Age of Onset   Emphysema Mother    Alcohol abuse Father    Emphysema Brother    Healthy Son    Healthy Daughter    Healthy Daughter     Medications: Patient's Medications  New Prescriptions   No medications on file  Previous Medications   ATORVASTATIN (LIPITOR) 10 MG TABLET    Take 10 mg by mouth daily.   CHOLECALCIFEROL (VITAMIN D3) 125 MCG (5000 UT) TABS    Take 1 tablet by mouth daily.   FLUTICASONE (FLONASE) 50 MCG/ACT NASAL SPRAY    Place 1 spray into both nostrils daily.   HYDROCHLOROTHIAZIDE (HYDRODIURIL) 25 MG TABLET    TAKE 1 TABLET (25 MG TOTAL) BY MOUTH DAILY.   LOSARTAN (COZAAR) 100 MG TABLET    TAKE 1 TABLET BY MOUTH EVERY DAY   OMEGA-3 FATTY ACIDS (FISH OIL PO)    Take 1 capsule by mouth daily.  Modified Medications   No medications on file  Discontinued Medications   No medications on file  Physical Exam:  Vitals:   11/22/21 0908  BP: 132/78  Pulse: 85  Temp: (!) 97.5 F (36.4 C)  TempSrc: Temporal  SpO2: 97%  Weight: 214 lb (97.1 kg)  Height: '5\' 6"'  (1.676 m)   Body mass index is 34.54 kg/m. Wt Readings from Last 3 Encounters:  11/22/21 214 lb (97.1 kg)  10/25/21 214 lb (97.1 kg)  07/16/21 210 lb (95.3 kg)    Physical Exam Constitutional:      General: He is not in acute distress.    Appearance: He is well-developed. He is not diaphoretic.  HENT:     Head:  Normocephalic and atraumatic.     Right Ear: External ear normal.     Left Ear: External ear normal.     Mouth/Throat:     Pharynx: No oropharyngeal exudate.  Eyes:     Conjunctiva/sclera: Conjunctivae normal.     Pupils: Pupils are equal, round, and reactive to light.  Cardiovascular:     Rate and Rhythm: Normal rate and regular rhythm.     Heart sounds: Normal heart sounds.  Pulmonary:     Effort: Pulmonary effort is normal.     Breath sounds: Normal breath sounds.  Abdominal:     General: Bowel sounds are normal.     Palpations: Abdomen is soft.  Musculoskeletal:        General: No tenderness.     Cervical back: Normal range of motion and neck supple.     Right lower leg: No edema.     Left lower leg: No edema.  Skin:    General: Skin is warm and dry.  Neurological:     Mental Status: He is alert and oriented to person, place, and time.     Labs reviewed: Basic Metabolic Panel: Recent Labs    05/13/21 1446 07/17/21 0020 10/25/21 1004  NA 139 132* 138  K 4.2 2.9* 4.3  CL 100 96* 100  CO2 '27 25 29  ' GLUCOSE 106* 118* 122  BUN 12 29* 11  CREATININE 0.82 1.13 0.75  CALCIUM 10.3 9.3 10.1   Liver Function Tests: Recent Labs    05/13/21 1446 10/25/21 1004  AST 30 29  ALT 49* 36  BILITOT 0.6 0.8  PROT 7.9 7.6   No results for input(s): "LIPASE", "AMYLASE" in the last 8760 hours. No results for input(s): "AMMONIA" in the last 8760 hours. CBC: Recent Labs    05/13/21 1446 07/17/21 0020 10/25/21 1004  WBC 7.5 16.2* 6.0  NEUTROABS 4,350 12.0* 3,330  HGB 15.6 12.8* 15.0  HCT 44.7 36.7* 44.3  MCV 92.9 90.4 92.7  PLT 203 202 203   Lipid Panel: Recent Labs    05/13/21 1446 10/25/21 1004  CHOL 249* 179  HDL 50 53  LDLCALC 153* 95  TRIG 278* 224*  CHOLHDL 5.0* 3.4   TSH: No results for input(s): "TSH" in the last 8760 hours. A1C: Lab Results  Component Value Date   HGBA1C 5.6 10/25/2021     Assessment/Plan 1. Mixed hyperlipidemia -improved  on lipitor. Continue medication with dietary modifications.  - atorvastatin (LIPITOR) 10 MG tablet; Take 1 tablet (10 mg total) by mouth daily.  Dispense: 90 tablet; Refill: 1 - CMP with eGFR([facility]); Future - Lipid panel; Future  2. Essential hypertension, benign -Blood pressure well controlled Continue current medications Recheck metabolic panel - CMP with eGFR([facility]); Future - CBC with Differential/Platelet; Future  3. Hyperglycemia -continue dietary modifications. - Hemoglobin A1c; Future  4. Primary osteoarthritis involving multiple  joints -ongoing, stable. Can use tylenol PRN  5. Aortic atherosclerosis (HCC) -continues statin and ASA - aspirin EC 81 MG tablet; Take 1 tablet (81 mg total) by mouth daily. Swallow whole.  Dispense: 30 tablet; Refill: 12  6. Obesity (BMI 30-39.9) --education provided on healthy weight loss through increase in physical activity and proper nutrition   7. Fungal infection of nail -discussed treatment with oral medication however he drinks several beers after work and does not wish to stop doing this. He has opted to try OTC treatment at this time.   8. Acute right shoulder pain -does not wish for PT at this time, as getting better.  -encouraged not to overuse, discussed risk of frozen shoulder -to ice twice daily at this time -follow up if does not improve or worsening pain.   Return in about 6 months (around 05/24/2022) for routine follow up, labs prior . Carlos American. [place], [facility] [phone]

## 2021-11-22 ENCOUNTER — Ambulatory Visit (INDEPENDENT_AMBULATORY_CARE_PROVIDER_SITE_OTHER): Payer: Medicare HMO | Admitting: Nurse Practitioner

## 2021-11-22 ENCOUNTER — Encounter: Payer: Self-pay | Admitting: Nurse Practitioner

## 2021-11-22 VITALS — BP 132/78 | HR 85 | Temp 97.5°F | Ht 66.0 in | Wt 214.0 lb

## 2021-11-22 DIAGNOSIS — I700 Atherosclerosis of aorta: Secondary | ICD-10-CM

## 2021-11-22 DIAGNOSIS — E782 Mixed hyperlipidemia: Secondary | ICD-10-CM

## 2021-11-22 DIAGNOSIS — I10 Essential (primary) hypertension: Secondary | ICD-10-CM | POA: Diagnosis not present

## 2021-11-22 DIAGNOSIS — M25511 Pain in right shoulder: Secondary | ICD-10-CM

## 2021-11-22 DIAGNOSIS — M159 Polyosteoarthritis, unspecified: Secondary | ICD-10-CM

## 2021-11-22 DIAGNOSIS — E669 Obesity, unspecified: Secondary | ICD-10-CM | POA: Diagnosis not present

## 2021-11-22 DIAGNOSIS — R739 Hyperglycemia, unspecified: Secondary | ICD-10-CM

## 2021-11-22 DIAGNOSIS — B351 Tinea unguium: Secondary | ICD-10-CM

## 2021-11-22 MED ORDER — ASPIRIN 81 MG PO TBEC: 81.0000 mg | DELAYED_RELEASE_TABLET | Freq: Every day | ORAL | 12 refills | Status: AC

## 2021-11-22 MED ORDER — ATORVASTATIN CALCIUM 10 MG PO TABS: 10.0000 mg | ORAL_TABLET | Freq: Every day | ORAL | 1 refills | Status: DC

## 2021-12-03 ENCOUNTER — Encounter (HOSPITAL_COMMUNITY): Payer: Self-pay | Admitting: Emergency Medicine

## 2021-12-03 ENCOUNTER — Other Ambulatory Visit: Payer: Self-pay

## 2021-12-03 ENCOUNTER — Emergency Department (HOSPITAL_COMMUNITY): Payer: Worker's Compensation

## 2021-12-03 ENCOUNTER — Emergency Department (HOSPITAL_COMMUNITY)
Admission: EM | Admit: 2021-12-03 | Discharge: 2021-12-04 | Disposition: A | Discharge status: Home / Self Care | Payer: Worker's Compensation | Attending: Student | Admitting: Student

## 2021-12-03 DIAGNOSIS — R0789 Other chest pain: Secondary | ICD-10-CM | POA: Insufficient documentation

## 2021-12-03 DIAGNOSIS — R0602 Shortness of breath: Secondary | ICD-10-CM | POA: Insufficient documentation

## 2021-12-03 DIAGNOSIS — W01198A Fall on same level from slipping, tripping and stumbling with subsequent striking against other object, initial encounter: Secondary | ICD-10-CM | POA: Diagnosis not present

## 2021-12-03 DIAGNOSIS — Z743 Need for continuous supervision: Secondary | ICD-10-CM | POA: Diagnosis not present

## 2021-12-03 DIAGNOSIS — M25512 Pain in left shoulder: Secondary | ICD-10-CM | POA: Diagnosis not present

## 2021-12-03 DIAGNOSIS — R1010 Upper abdominal pain, unspecified: Secondary | ICD-10-CM | POA: Diagnosis present

## 2021-12-03 DIAGNOSIS — Z7982 Long term (current) use of aspirin: Secondary | ICD-10-CM | POA: Insufficient documentation

## 2021-12-03 DIAGNOSIS — Y93F2 Activity, caregiving, lifting: Secondary | ICD-10-CM | POA: Insufficient documentation

## 2021-12-03 DIAGNOSIS — K5712 Diverticulitis of small intestine without perforation or abscess without bleeding: Secondary | ICD-10-CM | POA: Diagnosis not present

## 2021-12-03 DIAGNOSIS — K5710 Diverticulosis of small intestine without perforation or abscess without bleeding: Secondary | ICD-10-CM | POA: Diagnosis not present

## 2021-12-03 DIAGNOSIS — M25519 Pain in unspecified shoulder: Secondary | ICD-10-CM | POA: Diagnosis not present

## 2021-12-03 DIAGNOSIS — R000 Tachycardia, unspecified: Secondary | ICD-10-CM | POA: Diagnosis not present

## 2021-12-03 DIAGNOSIS — W19XXXA Unspecified fall, initial encounter: Secondary | ICD-10-CM

## 2021-12-03 DIAGNOSIS — R1084 Generalized abdominal pain: Secondary | ICD-10-CM | POA: Diagnosis not present

## 2021-12-03 DIAGNOSIS — R069 Unspecified abnormalities of breathing: Secondary | ICD-10-CM | POA: Diagnosis not present

## 2021-12-03 LAB — CBC
HCT: 41.6 % (ref 39.0–52.0)
Hemoglobin: 14.4 g/dL (ref 13.0–17.0)
MCH: 31.5 pg (ref 26.0–34.0)
MCHC: 34.6 g/dL (ref 30.0–36.0)
MCV: 91 fL (ref 80.0–100.0)
Platelets: 182 10*3/uL (ref 150–400)
RBC: 4.57 MIL/uL (ref 4.22–5.81)
RDW: 12.3 % (ref 11.5–15.5)
WBC: 14.9 10*3/uL — ABNORMAL HIGH (ref 4.0–10.5)
nRBC: 0 % (ref 0.0–0.2)

## 2021-12-03 LAB — TROPONIN I (HIGH SENSITIVITY)
Troponin I (High Sensitivity): <2 ng/L (ref <18)
Troponin I (High Sensitivity): <2 ng/L (ref <18)

## 2021-12-03 LAB — BASIC METABOLIC PANEL
Anion gap: 9 (ref 5–15)
BUN: 14 mg/dL (ref 8–23)
CO2: 22 mmol/L (ref 22–32)
Calcium: 9.3 mg/dL (ref 8.9–10.3)
Chloride: 106 mmol/L (ref 98–111)
Creatinine, Ser: 0.87 mg/dL (ref 0.61–1.24)
GFR, Estimated: >60 mL/min (ref >60)
Glucose, Bld: 176 mg/dL — ABNORMAL HIGH (ref 70–99)
Potassium: 3.5 mmol/L (ref 3.5–5.1)
Sodium: 137 mmol/L (ref 135–145)

## 2021-12-03 MED ORDER — AMOXICILLIN-POT CLAVULANATE 875-125 MG PO TABS: 1.0000 | ORAL_TABLET | Freq: Two times a day (BID) | ORAL | 0 refills | Status: DC

## 2021-12-03 MED ORDER — FENTANYL CITRATE PF 50 MCG/ML IJ SOSY
50.0000 ug | PREFILLED_SYRINGE | Freq: Once | INTRAMUSCULAR | Status: AC
  Administered 2021-12-03: 50 ug via INTRAVENOUS
  Filled 2021-12-03: qty 1

## 2021-12-03 MED ORDER — IOHEXOL 300 MG/ML  SOLN
100.0000 mL | Freq: Once | INTRAMUSCULAR | Status: AC | PRN
  Administered 2021-12-03: 100 mL via INTRAVENOUS

## 2021-12-03 MED ORDER — AMOXICILLIN-POT CLAVULANATE 875-125 MG PO TABS
1.0000 | ORAL_TABLET | Freq: Once | ORAL | Status: AC
  Administered 2021-12-04: 1 via ORAL
  Filled 2021-12-03: qty 1

## 2021-12-03 NOTE — ED Provider Notes (Signed)
[facility] EMERGENCY DEPARTMENT Provider Note   CSN: [id number] Arrival date & time: 12/03/21  1953     History Chief Complaint  Patient presents with   Shortness of Breath    Patrick Nielsen is a 71 y.o. male patient who presents to the emergency department with chest wall pain and upper abdominal pain after tripping and falling against cardboard boxes that had pieces of metal inside them.  Also complaining of right shoulder pain.  He denies any loss of consciousness.  Since the accident, he has been having some shortness of breath and chest pain.  No nausea, vomiting, diarrhea, fever, chills.   Shortness of Breath      Home Medications Prior to Admission medications   Medication Sig Start Date End Date Taking? Authorizing Provider  amoxicillin-clavulanate (AUGMENTIN) 875-125 MG tablet Take 1 tablet by mouth every 12 (twelve) hours. 12/03/21  Yes Honor Loh M, PA-C  aspirin EC 81 MG tablet Take 1 tablet (81 mg total) by mouth daily. Swallow whole. 11/22/21   [name], NP  atorvastatin (LIPITOR) 10 MG tablet Take 1 tablet (10 mg total) by mouth daily. 11/22/21   [name], NP  Cholecalciferol (VITAMIN D3) 125 MCG (5000 UT) TABS Take 1 tablet by mouth daily.    [provider]  fluticasone (FLONASE) 50 MCG/ACT nasal spray Place 1 spray into both nostrils daily. 10/16/20   Elenore Paddy, NP  hydrochlorothiazide (HYDRODIURIL) 25 MG tablet TAKE 1 TABLET (25 MG TOTAL) BY MOUTH DAILY. 10/04/21   [name], NP  losartan (COZAAR) 100 MG tablet TAKE 1 TABLET BY MOUTH EVERY DAY 10/04/21   [name], NP  Omega-3 Fatty Acids (FISH OIL PO) Take 1 capsule by mouth daily.    [provider]      Allergies    Patient has no known allergies.    Review of Systems   Review of Systems  Respiratory:  Positive for shortness of breath.   All other systems reviewed and are negative.   Physical Exam Updated Vital Signs BP (!) 146/81   Pulse (!)  106   Temp 98.5 F (36.9 C) (Oral)   Resp 17   Ht 5\' 6"  (1.676 m)   Wt 97 kg   SpO2 95%   BMI 34.52 kg/m  Physical Exam Vitals and nursing note reviewed.  Constitutional:      General: He is not in acute distress.    Appearance: Normal appearance.  HENT:     Head: Normocephalic and atraumatic.  Eyes:     General:        Right eye: No discharge.        Left eye: No discharge.  Cardiovascular:     Comments: Regular rate and rhythm.  S1/S2 are distinct without any evidence of murmur, rubs, or gallops.  Radial pulses are 2+ bilaterally.  Dorsalis pedis pulses are 2+ bilaterally.  No evidence of pedal edema. Pulmonary:     Comments: Clear to auscultation bilaterally.  Normal effort.  No respiratory distress.  No evidence of wheezes, rales, or rhonchi heard throughout. Chest:     Comments: Chest wall is nontender to palpation. Abdominal:     General: Abdomen is flat. Bowel sounds are normal. There is no distension.     Tenderness: There is generalized abdominal tenderness. There is no guarding or rebound.  Musculoskeletal:        General: Normal range of motion.     Cervical back: Neck supple.  Skin:    General: Skin is warm and dry.     Findings: No rash.  Neurological:     General: No focal deficit present.     Mental Status: He is alert.  Psychiatric:        Mood and Affect: Mood normal.        Behavior: Behavior normal.     ED Results / Procedures / Treatments   Labs (all labs ordered are listed, but only abnormal results are displayed) Labs Reviewed  BASIC METABOLIC PANEL - Abnormal; Notable for the following components:      Result Value   Glucose, Bld 176 (*)    All other components within normal limits  CBC - Abnormal; Notable for the following components:   WBC 14.9 (*)    All other components within normal limits  TROPONIN I (HIGH SENSITIVITY)  TROPONIN I (HIGH SENSITIVITY)    EKG None  Radiology DG Shoulder Right  Result Date: 12/03/2021 CLINICAL  DATA:  Fall, shoulder pain EXAM: RIGHT SHOULDER - 2+ VIEW COMPARISON:  None Available. FINDINGS: Degenerative changes in the [facility] joint with joint space narrowing and spurring. Glenohumeral joint is maintained. No acute bony abnormality. Specifically, no fracture, subluxation, or dislocation. Soft tissues are intact. IMPRESSION: Degenerative changes in the right AC joint. No acute bony abnormality. Electronically Signed   By: Charlett Nose M.D.   On: 12/03/2021 22:17   CT ABDOMEN PELVIS W CONTRAST  Result Date: 12/03/2021 CLINICAL DATA:  Abdominal pain, acute, nonlocalized EXAM: CT ABDOMEN AND PELVIS WITH CONTRAST TECHNIQUE: Multidetector CT imaging of the abdomen and pelvis was performed using the standard protocol following bolus administration of intravenous contrast. RADIATION DOSE REDUCTION: This exam was performed according to the departmental dose-optimization program which includes automated exposure control, adjustment of the mA and/or kV according to patient size and/or use of iterative reconstruction technique. CONTRAST:  OMNIPAQUE IOHEXOL 300 MG/ML  SOLN COMPARISON:  None Available. FINDINGS: Lower chest: No acute abnormality. Hepatobiliary: Diffuse low-density throughout the liver compatible with fatty infiltration. No focal abnormality. Gallbladder unremarkable. Pancreas: No focal abnormality or ductal dilatation. Spleen: No focal abnormality.  Normal size. Adrenals/Urinary Tract: No adrenal abnormality. No focal renal abnormality. No stones or hydronephrosis. Urinary bladder is unremarkable. Stomach/Bowel: There is inflammation adjacent to left abdominal small bowel loops and what appears to be a small bowel diverticulum compatible with diverticulitis. No evidence of bowel obstruction. Normal appendix. Large bowel and stomach unremarkable. Vascular/Lymphatic: Aortic atherosclerosis. No evidence of aneurysm or adenopathy. Reproductive: No visible focal abnormality. Other: No free fluid or free  air. Musculoskeletal: No acute bony abnormality. IMPRESSION: Inflammation adjacent to the jejunal small bowel loop in the left abdomen with associated small bowel diverticulum. Findings compatible with small bowel diverticulitis. Hepatic steatosis. Aortic atherosclerosis. Electronically Signed   By: Charlett Nose M.D.   On: 12/03/2021 22:16   DG Chest 2 View  Result Date: 12/03/2021 CLINICAL DATA:  Chest pain and shortness of breath. EXAM: CHEST - 2 VIEW COMPARISON:  Chest x-ray 07/16/2021 FINDINGS: The heart size and mediastinal contours are within normal limits. Both lungs are clear. The visualized skeletal structures are unremarkable. IMPRESSION: No active cardiopulmonary disease. Electronically Signed   By: Darliss Cheney M.D.   On: 12/03/2021 21:02    Procedures Procedures    Medications Ordered in ED Medications  amoxicillin-clavulanate (AUGMENTIN) 875-125 MG per tablet 1 tablet (has no administration in time range)  fentaNYL (SUBLIMAZE) injection 50 mcg (50 mcg Intravenous Given 12/03/21 2114)  iohexol (OMNIPAQUE) 300 MG/ML solution 100 mL (100 mLs Intravenous Contrast Given 12/03/21 2142)    ED Course/ Medical Decision Making/ A&P Clinical Course as of 12/03/21 2336  Tue Dec 03, 2021  2331 CBC(!) There is evidence of leukocytosis. No signs of anemia. [CF]  2332 Basic metabolic panel(!) Glucose is elevated otherwise no abnormalities. [CF]  2332 Troponin I (High Sensitivity) Initial and delta troponin are normal. [CF]  2332 DG Shoulder Right I personally reviewed this image did not see any evidence of fractures or dislocations.  I do agree with the radiologist interpretation. [CF]  2333 CT ABDOMEN PELVIS W CONTRAST I personally reviewed this study there is evidence of some heterogenicity in the small bowel.  I do agree with radiologist interpretation for small bowel diverticulitis. [CF]  2333 DG Chest 2 View I personally ordered and interpreted this image did not see any evidence of  rib fractures.  I do agree with radiologist interpretation. [CF]  2333 Patient feeling better on reassessment.  Heart rate has normalized.  He was in the 90s.  Patient wishing to go home.  I notified him of all labs and imaging.  I think this is amenable plan.  We will give him 1 dose of antibiotics for small bowel diverticulitis and discharged him with Augmentin. [CF]    Clinical Course User Index [CF] Teressa Lower, PA-C                           Medical Decision Making Rajendra Spiller is a 71 y.o. male patient who presents to the emergency department today for further evaluation of mechanical trip and fall into some cardboard boxes that was containing very heavy piece of metal.  Will evaluate with a single troponin in addition to some cardiac labs.  Chest x-ray was ordered in triage.  I am going to add on a CT abdomen pelvis with contrast as he is having some generalized tenderness.  I will also add a right shoulder x-ray to further evaluate.  Patient is tachycardic and hypertensive.  I think this is likely secondary to pain.  We will give him some pain medicine and plan to reassess.  As highlighted in ED course, patient is feeling better after pain medication.  We will treat him with Augmentin for small bowel diverticulitis.  I am and have him follow-up with his primary care doctor for further evaluation.  No evidence of intrathoracic or intra-abdominal trauma today.  Shoulder does not appear broken.  This likely muscular strains.  Strict return precautions were discussed.  He is safe for discharge.  Amount and/or Complexity of Data Reviewed Labs: ordered. Decision-making details documented in ED Course. Radiology: ordered. Decision-making details documented in ED Course.  Risk Prescription drug management.   Final Clinical Impression(s) / ED Diagnoses Final diagnoses:  Diverticulitis of small intestine, unspecified bleeding status, unspecified complication status  Fall, initial encounter     Rx / DC Orders ED Discharge Orders          Ordered    amoxicillin-clavulanate (AUGMENTIN) 875-125 MG tablet  Every 12 hours        12/03/21 2334              Teressa Lower, PA-C 12/03/21 2336    Glendora Score, MD 12/06/21 1139

## 2021-12-03 NOTE — ED Triage Notes (Signed)
Pt c/o sob and chest pain since falling while carrying a box today. Pt states the box hit his chest and upper abd.

## 2021-12-03 NOTE — Discharge Instructions (Signed)
Please take antibiotics as prescribed.  I would like you to follow-up with your primary care doctor for further evaluation.  Please return to the emerged department for any worsening symptoms.

## 2021-12-04 MED ORDER — AMOXICILLIN-POT CLAVULANATE 875-125 MG PO TABS: 1.0000 | ORAL_TABLET | Freq: Two times a day (BID) | ORAL | 0 refills | Status: DC

## 2021-12-16 ENCOUNTER — Encounter: Payer: Self-pay | Admitting: Nurse Practitioner

## 2021-12-16 ENCOUNTER — Ambulatory Visit (INDEPENDENT_AMBULATORY_CARE_PROVIDER_SITE_OTHER): Payer: Medicare HMO | Admitting: Nurse Practitioner

## 2021-12-16 VITALS — BP 124/72 | HR 67 | Temp 97.9°F | Ht 66.0 in | Wt 213.0 lb

## 2021-12-16 DIAGNOSIS — M25511 Pain in right shoulder: Secondary | ICD-10-CM

## 2021-12-16 DIAGNOSIS — K5792 Diverticulitis of intestine, part unspecified, without perforation or abscess without bleeding: Secondary | ICD-10-CM | POA: Diagnosis not present

## 2021-12-16 LAB — CBC WITH DIFFERENTIAL/PLATELET
Absolute Monocytes: 793 cells/uL (ref 200–950)
Basophils Absolute: 54 cells/uL (ref 0–200)
Basophils Relative: 0.7 %
Eosinophils Absolute: 139 cells/uL (ref 15–500)
Eosinophils Relative: 1.8 %
HCT: 39.8 % (ref 38.5–50.0)
Hemoglobin: 13.8 g/dL (ref 13.2–17.1)
Lymphs Abs: 1894 cells/uL (ref 850–3900)
MCH: 31.8 pg (ref 27.0–33.0)
MCHC: 34.7 g/dL (ref 32.0–36.0)
MCV: 91.7 fL (ref 80.0–100.0)
MPV: 10.4 fL (ref 7.5–12.5)
Monocytes Relative: 10.3 %
Neutro Abs: 4820 cells/uL (ref 1500–7800)
Neutrophils Relative %: 62.6 %
Platelets: 287 10*3/uL (ref 140–400)
RBC: 4.34 10*6/uL (ref 4.20–5.80)
RDW: 12 % (ref 11.0–15.0)
Total Lymphocyte: 24.6 %
WBC: 7.7 10*3/uL (ref 3.8–10.8)

## 2021-12-16 NOTE — Patient Instructions (Signed)
You have diverticulosis when this is inflamed/irritated or infected it is caused diverticulitis

## 2021-12-16 NOTE — Progress Notes (Signed)
Careteam: Patient Care Team: [name], NP as PCP - General (Geriatric Medicine)  PLACE OF SERVICE:  [facility]  Advanced Directive information    No Known Allergies  Chief Complaint  Patient presents with   Follow-up    ED follow-up     HPI: Patient is a 71 y.o. male for ED follow up. he went to the ED with chest pain and upper abdominal pain. Found to have acute diverticulitis and placed on augmentin.  Reports he only completed 5 days of the Augmentin due to diarrhea.  No longer having abdominal pain. No fevers.  Reports he feeling really good now.   He also hit his shoulder, which had been bothering him prior but overall this has been improving.   Review of Systems:  Review of Systems  Constitutional:  Negative for chills, fever and weight loss.  HENT:  Negative for tinnitus.   Respiratory:  Negative for cough, sputum production and shortness of breath.   Cardiovascular:  Negative for chest pain, palpitations and leg swelling.  Gastrointestinal:  Negative for abdominal pain, constipation, diarrhea and heartburn.  Genitourinary:  Negative for dysuria, frequency and urgency.  Musculoskeletal:  Negative for back pain, falls, joint pain and myalgias.  Skin: Negative.   Neurological:  Negative for dizziness and headaches.  Psychiatric/Behavioral:  Negative for depression and memory loss. The patient does not have insomnia.      Past Medical History:  Diagnosis Date   Essential hypertension, benign 03/02/2019   HLD (hyperlipidemia) 03/02/2019   Vitamin D deficiency disease 03/02/2019   History reviewed. No pertinent surgical history. Social History:   reports that he quit smoking about 25 years ago. His smoking use included cigarettes. He has a 60.00 pack-year smoking history. He has never used smokeless tobacco. He reports current alcohol use of about 30.0 standard drinks of alcohol per week. He reports that he does not use drugs.  Family History   Problem Relation Age of Onset   Emphysema Mother    Alcohol abuse Father    Emphysema Brother    Healthy Son    Healthy Daughter    Healthy Daughter     Medications: Patient's Medications  New Prescriptions   No medications on file  Previous Medications   ASPIRIN EC 81 MG TABLET    Take 1 tablet (81 mg total) by mouth daily. Swallow whole.   ATORVASTATIN (LIPITOR) 10 MG TABLET    Take 1 tablet (10 mg total) by mouth daily.   CHOLECALCIFEROL (VITAMIN D3) 125 MCG (5000 UT) TABS    Take 1 tablet by mouth daily.   FLUTICASONE (FLONASE) 50 MCG/ACT NASAL SPRAY    Place 1 spray into both nostrils daily.   HYDROCHLOROTHIAZIDE (HYDRODIURIL) 25 MG TABLET    TAKE 1 TABLET (25 MG TOTAL) BY MOUTH DAILY.   LOSARTAN (COZAAR) 100 MG TABLET    TAKE 1 TABLET BY MOUTH EVERY DAY   OMEGA-3 FATTY ACIDS (FISH OIL PO)    Take 1 capsule by mouth daily.  Modified Medications   No medications on file  Discontinued Medications   AMOXICILLIN-CLAVULANATE (AUGMENTIN) 875-125 MG TABLET    Take 1 tablet by mouth every 12 (twelve) hours.    Physical Exam:  Vitals:   12/16/21 0814  BP: 124/72  Pulse: 67  Temp: 97.9 F (36.6 C)  TempSrc: Temporal  SpO2: 98%  Weight: 213 lb (96.6 kg)  Height: 5\' 6"  (1.676 m)   Body mass index is 34.38 kg/m.  Wt Readings from Last 3 Encounters:  12/16/21 213 lb (96.6 kg)  12/03/21 213 lb 13.5 oz (97 kg)  11/22/21 214 lb (97.1 kg)    Physical Exam Constitutional:      General: He is not in acute distress.    Appearance: He is well-developed. He is not diaphoretic.  HENT:     Head: Normocephalic and atraumatic.     Right Ear: External ear normal.     Left Ear: External ear normal.     Mouth/Throat:     Pharynx: No oropharyngeal exudate.  Eyes:     Conjunctiva/sclera: Conjunctivae normal.     Pupils: Pupils are equal, round, and reactive to light.  Cardiovascular:     Rate and Rhythm: Normal rate and regular rhythm.     Heart sounds: Normal heart sounds.   Pulmonary:     Effort: Pulmonary effort is normal.     Breath sounds: Normal breath sounds.  Abdominal:     General: Bowel sounds are normal.     Palpations: Abdomen is soft.  Musculoskeletal:        General: No tenderness.     Cervical back: Normal range of motion and neck supple.     Right lower leg: No edema.     Left lower leg: No edema.  Skin:    General: Skin is warm and dry.  Neurological:     Mental Status: He is alert and oriented to person, place, and time.     Labs reviewed: Basic Metabolic Panel: Recent Labs    07/17/21 0020 10/25/21 1004 12/03/21 2030  NA 132* 138 137  K 2.9* 4.3 3.5  CL 96* 100 106  CO2 25 29 22   GLUCOSE 118* 122 176*  BUN 29* 11 14  CREATININE 1.13 0.75 0.87  CALCIUM 9.3 10.1 9.3   Liver Function Tests: Recent Labs    05/13/21 1446 10/25/21 1004  AST 30 29  ALT 49* 36  BILITOT 0.6 0.8  PROT 7.9 7.6   No results for input(s): "LIPASE", "AMYLASE" in the last 8760 hours. No results for input(s): "AMMONIA" in the last 8760 hours. CBC: Recent Labs    05/13/21 1446 07/17/21 0020 10/25/21 1004 12/03/21 2030  WBC 7.5 16.2* 6.0 14.9*  NEUTROABS 4,350 12.0* 3,330  --   HGB 15.6 12.8* 15.0 14.4  HCT 44.7 36.7* 44.3 41.6  MCV 92.9 90.4 92.7 91.0  PLT 203 202 203 182   Lipid Panel: Recent Labs    05/13/21 1446 10/25/21 1004  CHOL 249* 179  HDL 50 53  LDLCALC 153* 95  TRIG 278* 224*  CHOLHDL 5.0* 3.4   TSH: No results for input(s): "TSH" in the last 8760 hours. A1C: Lab Results  Component Value Date   HGBA1C 5.6 10/25/2021     Assessment/Plan 1. Diverticulitis -symptoms have resolved, he completed 5 days of augmentin, encouraged in future to take full course.  -educated provided on diverticulosis- high fiber, increase fluids.  - CBC with Differential/Platelet  2. Acute pain of right shoulder -has significantly improved with ice and exercises.    12/25/2021. Janene Harvey  [facility] [phone]

## 2022-04-04 ENCOUNTER — Other Ambulatory Visit: Payer: Self-pay | Admitting: Nurse Practitioner

## 2022-04-04 DIAGNOSIS — I10 Essential (primary) hypertension: Secondary | ICD-10-CM

## 2022-05-16 ENCOUNTER — Other Ambulatory Visit: Payer: Self-pay

## 2022-05-16 DIAGNOSIS — R739 Hyperglycemia, unspecified: Secondary | ICD-10-CM

## 2022-05-16 DIAGNOSIS — I10 Essential (primary) hypertension: Secondary | ICD-10-CM

## 2022-05-16 DIAGNOSIS — E782 Mixed hyperlipidemia: Secondary | ICD-10-CM

## 2022-05-25 ENCOUNTER — Other Ambulatory Visit: Payer: Self-pay | Admitting: Nurse Practitioner

## 2022-05-25 DIAGNOSIS — E782 Mixed hyperlipidemia: Secondary | ICD-10-CM

## 2022-05-29 ENCOUNTER — Other Ambulatory Visit: Payer: Medicare HMO

## 2022-05-30 ENCOUNTER — Other Ambulatory Visit: Payer: Medicare HMO

## 2022-05-30 DIAGNOSIS — R739 Hyperglycemia, unspecified: Secondary | ICD-10-CM | POA: Diagnosis not present

## 2022-05-30 DIAGNOSIS — I10 Essential (primary) hypertension: Secondary | ICD-10-CM | POA: Diagnosis not present

## 2022-05-30 DIAGNOSIS — E782 Mixed hyperlipidemia: Secondary | ICD-10-CM | POA: Diagnosis not present

## 2022-05-30 LAB — LIPID PANEL
Cholesterol: 185 mg/dL (ref <200)
HDL: 55 mg/dL (ref >=40)
LDL Cholesterol (Calc): 103 mg/dL (calc) — ABNORMAL HIGH
Non-HDL Cholesterol (Calc): 130 mg/dL (calc) — ABNORMAL HIGH (ref <130)
Total CHOL/HDL Ratio: 3.4 (calc) (ref <5.0)
Triglycerides: 158 mg/dL — ABNORMAL HIGH (ref <150)

## 2022-05-30 LAB — CBC WITH DIFFERENTIAL/PLATELET
Absolute Monocytes: 791 cells/uL (ref 200–950)
Basophils Absolute: 30 cells/uL (ref 0–200)
Basophils Relative: 0.5 %
Eosinophils Absolute: 100 cells/uL (ref 15–500)
Eosinophils Relative: 1.7 %
HCT: 42.3 % (ref 38.5–50.0)
Hemoglobin: 14.8 g/dL (ref 13.2–17.1)
Lymphs Abs: 1817 cells/uL (ref 850–3900)
MCH: 31.9 pg (ref 27.0–33.0)
MCHC: 35 g/dL (ref 32.0–36.0)
MCV: 91.2 fL (ref 80.0–100.0)
MPV: 10.6 fL (ref 7.5–12.5)
Monocytes Relative: 13.4 %
Neutro Abs: 3162 cells/uL (ref 1500–7800)
Neutrophils Relative %: 53.6 %
Platelets: 174 10*3/uL (ref 140–400)
RBC: 4.64 10*6/uL (ref 4.20–5.80)
RDW: 13 % (ref 11.0–15.0)
Total Lymphocyte: 30.8 %
WBC: 5.9 10*3/uL (ref 3.8–10.8)

## 2022-05-30 LAB — COMPLETE METABOLIC PANEL WITH GFR
AG Ratio: 1.3 (calc) (ref 1.0–2.5)
ALT: 32 U/L (ref 9–46)
AST: 26 U/L (ref 10–35)
Albumin: 4.2 g/dL (ref 3.6–5.1)
Alkaline phosphatase (APISO): 66 U/L (ref 35–144)
BUN: 14 mg/dL (ref 7–25)
CO2: 30 mmol/L (ref 20–32)
Calcium: 9.5 mg/dL (ref 8.6–10.3)
Chloride: 101 mmol/L (ref 98–110)
Creat: 0.71 mg/dL (ref 0.70–1.35)
Globulin: 3.2 g/dL (calc) (ref 1.9–3.7)
Glucose, Bld: 116 mg/dL — ABNORMAL HIGH (ref 65–99)
Potassium: 4.1 mmol/L (ref 3.5–5.3)
Sodium: 138 mmol/L (ref 135–146)
Total Bilirubin: 0.6 mg/dL (ref 0.2–1.2)
Total Protein: 7.4 g/dL (ref 6.1–8.1)
eGFR: 99 mL/min/{1.73_m2} (ref >=60)

## 2022-05-31 LAB — HEMOGLOBIN A1C
Hgb A1c MFr Bld: 5.8 % of total Hgb — ABNORMAL HIGH (ref <5.7)
Mean Plasma Glucose: 120 mg/dL
eAG (mmol/L): 6.6 mmol/L

## 2022-06-02 ENCOUNTER — Ambulatory Visit: Payer: Medicare HMO | Admitting: Nurse Practitioner

## 2022-06-04 ENCOUNTER — Other Ambulatory Visit: Payer: Medicare HMO

## 2022-06-21 DIAGNOSIS — Z6834 Body mass index (BMI) 34.0-34.9, adult: Secondary | ICD-10-CM | POA: Diagnosis not present

## 2022-06-21 DIAGNOSIS — R030 Elevated blood-pressure reading, without diagnosis of hypertension: Secondary | ICD-10-CM | POA: Diagnosis not present

## 2022-06-21 DIAGNOSIS — E669 Obesity, unspecified: Secondary | ICD-10-CM | POA: Diagnosis not present

## 2022-06-21 DIAGNOSIS — M25512 Pain in left shoulder: Secondary | ICD-10-CM | POA: Diagnosis not present

## 2022-06-21 DIAGNOSIS — M25522 Pain in left elbow: Secondary | ICD-10-CM | POA: Diagnosis not present

## 2022-06-23 ENCOUNTER — Ambulatory Visit (INDEPENDENT_AMBULATORY_CARE_PROVIDER_SITE_OTHER): Payer: Medicare HMO | Admitting: Nurse Practitioner

## 2022-06-23 ENCOUNTER — Encounter: Payer: Self-pay | Admitting: Nurse Practitioner

## 2022-06-23 ENCOUNTER — Ambulatory Visit (HOSPITAL_COMMUNITY)
Admission: RE | Admit: 2022-06-23 | Discharge: 2022-06-23 | Disposition: A | Discharge status: Home / Self Care | Payer: Medicare HMO | Source: Ambulatory Visit | Attending: Nurse Practitioner | Admitting: Nurse Practitioner

## 2022-06-23 VITALS — BP 140/72 | HR 89 | Temp 97.8°F | Ht 66.0 in | Wt 212.6 lb

## 2022-06-23 DIAGNOSIS — B351 Tinea unguium: Secondary | ICD-10-CM | POA: Diagnosis not present

## 2022-06-23 DIAGNOSIS — M25511 Pain in right shoulder: Secondary | ICD-10-CM

## 2022-06-23 DIAGNOSIS — E782 Mixed hyperlipidemia: Secondary | ICD-10-CM

## 2022-06-23 DIAGNOSIS — R69 Illness, unspecified: Secondary | ICD-10-CM | POA: Diagnosis not present

## 2022-06-23 DIAGNOSIS — L03114 Cellulitis of left upper limb: Secondary | ICD-10-CM | POA: Diagnosis not present

## 2022-06-23 DIAGNOSIS — I10 Essential (primary) hypertension: Secondary | ICD-10-CM | POA: Diagnosis not present

## 2022-06-23 DIAGNOSIS — R7303 Prediabetes: Secondary | ICD-10-CM | POA: Diagnosis not present

## 2022-06-23 DIAGNOSIS — M79642 Pain in left hand: Secondary | ICD-10-CM

## 2022-06-23 DIAGNOSIS — F1010 Alcohol abuse, uncomplicated: Secondary | ICD-10-CM

## 2022-06-23 DIAGNOSIS — M7989 Other specified soft tissue disorders: Secondary | ICD-10-CM | POA: Diagnosis not present

## 2022-06-23 MED ORDER — DOXYCYCLINE HYCLATE 100 MG PO TABS: 100.0000 mg | ORAL_TABLET | Freq: Two times a day (BID) | ORAL | 0 refills | Status: DC

## 2022-06-23 NOTE — Patient Instructions (Signed)
Stop prednisone  Tylenol 1000 mg by mouth every 8 hours as needed for pain  Start doxycycline today- take twice daily with food.

## 2022-06-23 NOTE — Progress Notes (Signed)
Careteam: Patient Care Team: Lauree Chandler, NP as PCP - General (Geriatric Medicine)  PLACE OF SERVICE:  [place] Directive information    No Known Allergies  Chief Complaint  Patient presents with   Medical Management of Chronic Issues    6 month follow-up. Discuss need for shingrix, flu vaccine, and additional covid boosters or post pone if patient refuses. [facility] verified.      HPI: Patient is a 72 y.o. male here for routine follow-up.   He fell off a ladder on the 25th. Missed the last step when he was going back down.  Went to urgent care and got xrays of his left elbow and left shoulder; they were clear They gave him a 5 day course of prednisone and he is on day 2. L hand is hot and swollen today. It has been hot and swollen since he fell. He is unable to form a fist. Denies fevers or chills.  Pain is 7/10. He thinks he was not supposed to take tylenol or ibuprofen so hasn't tried anything for the pain. Prednisone hasn't been making a difference as yet.  He has a cuff at home but does not check his BP. Elevated today more than usual, but he is in pain.   R shoulder pain is much improved.   7 or 8 drinks of ETOH a day. Previous smoker.   Fungal infection of nail: patient never used anything but is not concerned today and feels it is getting better.  A1C slightly elevated with elevated fasting blood glucose.  Triglycerides improved from last visit but still high at 158, LDL at 103. Discussed diet and lifestyle modifications with patient, eliminating simple carbohydrates.   Review of Systems:  Review of Systems  Constitutional:  Negative for chills, fever, malaise/fatigue and weight loss.  HENT:  Negative for tinnitus.   Respiratory:  Negative for cough, sputum production and shortness of breath.   Cardiovascular:  Negative for chest pain, palpitations and leg swelling.  Gastrointestinal:  Negative for abdominal pain, constipation, diarrhea and  heartburn.  Genitourinary:  Negative for dysuria, frequency and urgency.  Musculoskeletal:  Positive for joint pain and myalgias. Negative for back pain and falls.       L hand pain focused at the metacarpophalangeal joints.  L arm sore. Pain 7/10  Skin:        Redness to left hand   Neurological:  Negative for dizziness and headaches.  Psychiatric/Behavioral:  Negative for depression and memory loss. The patient does not have insomnia.    Past Medical History:  Diagnosis Date   Essential hypertension, benign 03/02/2019   HLD (hyperlipidemia) 03/02/2019   Vitamin D deficiency disease 03/02/2019   History reviewed. No pertinent surgical history. Social History:   reports that he quit smoking about 26 years ago. His smoking use included cigarettes. He has a 60.00 pack-year smoking history. He has never used smokeless tobacco. He reports current alcohol use of about 30.0 standard drinks of alcohol per week. He reports that he does not use drugs.  Family History  Problem Relation Age of Onset   Emphysema Mother    Alcohol abuse Father    Emphysema Brother    Healthy Son    Healthy Daughter    Healthy Daughter     Medications: Patient's Medications  New Prescriptions   No medications on file  Previous Medications   ASPIRIN EC 81 MG TABLET    Take 1 tablet (81 mg total)  by mouth daily. Swallow whole.   ATORVASTATIN (LIPITOR) 10 MG TABLET    TAKE 1 TABLET BY MOUTH EVERY DAY   CHOLECALCIFEROL (VITAMIN D3) 125 MCG (5000 UT) TABS    Take 1 tablet by mouth daily.   FLUTICASONE (FLONASE) 50 MCG/ACT NASAL SPRAY    Place 1 spray into both nostrils daily.   HYDROCHLOROTHIAZIDE (HYDRODIURIL) 25 MG TABLET    TAKE 1 TABLET (25 MG TOTAL) BY MOUTH DAILY.   LOSARTAN (COZAAR) 100 MG TABLET    TAKE 1 TABLET BY MOUTH EVERY DAY   OMEGA-3 FATTY ACIDS (FISH OIL PO)    Take 1 capsule by mouth daily.   PREDNISONE (DELTASONE) 20 MG TABLET    Take 20 mg by mouth daily with breakfast.  Modified Medications    No medications on file  Discontinued Medications   No medications on file    Physical Exam:  Vitals:   06/23/22 0854 06/23/22 1340  BP: (!) 146/96 (!) 140/72  Pulse: 89   Temp: 97.8 F (36.6 C)   SpO2: 96%   Weight: 212 lb 9.6 oz (96.4 kg)   Height: 5\' 6"  (1.676 m)    Body mass index is 34.31 kg/m. Wt Readings from Last 3 Encounters:  06/23/22 212 lb 9.6 oz (96.4 kg)  12/16/21 213 lb (96.6 kg)  12/03/21 213 lb 13.5 oz (97 kg)    Physical Exam Vitals and nursing note reviewed. Exam conducted with a chaperone present.  Cardiovascular:     Rate and Rhythm: Normal rate and regular rhythm.     Heart sounds: Normal heart sounds. No murmur heard. Pulmonary:     Breath sounds: Normal breath sounds. No wheezing or rales.  Abdominal:     General: Bowel sounds are normal.     Palpations: Abdomen is soft. There is no mass.     Tenderness: There is no abdominal tenderness. There is no guarding.  Musculoskeletal:        General: Swelling, tenderness and signs of injury present.     Left hand: Swelling and tenderness present. Decreased range of motion.     Comments: Erythema and swelling +3 in LUE  Skin:    General: Skin is warm and dry.  Neurological:     Mental Status: He is alert and oriented to person, place, and time. Mental status is at baseline.  Psychiatric:        Mood and Affect: Mood normal.        Behavior: Behavior normal.        Judgment: Judgment normal.    Labs reviewed: Basic Metabolic Panel: Recent Labs    10/25/21 1004 12/03/21 2030 05/30/22 0857  NA 138 137 138  K 4.3 3.5 4.1  CL 100 106 101  CO2 29 22 30   GLUCOSE 122 176* 116*  BUN 11 14 14   CREATININE 0.75 0.87 0.71  CALCIUM 10.1 9.3 9.5   Liver Function Tests: Recent Labs    10/25/21 1004 05/30/22 0857  AST 29 26  ALT 36 32  BILITOT 0.8 0.6  PROT 7.6 7.4   No results for input(s): "LIPASE", "AMYLASE" in the last 8760 hours. No results for input(s): "AMMONIA" in the last 8760  hours. CBC: Recent Labs    10/25/21 1004 12/03/21 2030 12/16/21 0852 05/30/22 0857  WBC 6.0 14.9* 7.7 5.9  NEUTROABS 3,330  --  4,820 3,162  HGB 15.0 14.4 13.8 14.8  HCT 44.3 41.6 39.8 42.3  MCV 92.7 91.0 91.7 91.2  PLT 203  182 287 174   Lipid Panel: Recent Labs    10/25/21 1004 05/30/22 0857  CHOL 179 185  HDL 53 55  LDLCALC 95 103*  TRIG 224* 158*  CHOLHDL 3.4 3.4   TSH: No results for input(s): "TSH" in the last 8760 hours. A1C: Lab Results  Component Value Date   HGBA1C 5.8 (H) 05/30/2022     Assessment/Plan There are no diagnoses linked to this encounter.   1. Left hand pain Pt fell of a ladder on 1/25. Hand was not examined at urgent care 1/27. Imaging to rule out fx. Stop prednisone at this time -start tylenol 1000 mg by mouth every 8 hours as needed pain -to ice hand TID ~20 mins - DG Hand Complete Left; Future  2. Cellulitis of left upper extremity Erythema present in patient's L arm up to L shoulder. L hand is quite swollen and very painful. Patient has limited ROM in his fingers. Rec imaging and antibiotics. Pt educated on reasons to seek medical care such as but not limited to worsening symptoms and fevers or chills.  - doxycycline (VIBRA-TABS) 100 MG tablet; Take 1 tablet (100 mg total) by mouth 2 (two) times daily.  Dispense: 20 tablet; Refill: 0  3. Prediabetes Diet and lifestyle modifications discussed and encouraged with patient. Monitor.  4. Mixed hyperlipidemia Diet and lifestyle modifications discussed and encouraged with patient. Continue lipitor. Monitor.  5. Acute pain of right shoulder Resolved with good ROM at this time.   6. Fungal infection of nail Present but patient is unconcerned at this time. He is unable to take oral treatment due to daily alcohol intake.  7. ETOH abuse Encouraged patient to cut back or quit; he is not ready at this time.   8. Essential hypertension, benign Patient's BP typically fairly well-controlled  but elevated today likely due to pain from L hand. Continue losartan and hydrochlorothiazide with diet and lifestyle modifications.  Return in about 6 months (around 12/22/2022).  Student- Darcel Bayley O'Berry ACPCNP-S  I personally was present during the history, physical exam and medical decision-making activities of this service and have verified that the service and findings are accurately documented in the student's note Alira Fretwell K. [facility] [phone]

## 2022-06-26 ENCOUNTER — Other Ambulatory Visit: Payer: Self-pay | Admitting: Nurse Practitioner

## 2022-06-26 MED ORDER — PREDNISONE 10 MG (21) PO TBPK: ORAL_TABLET | ORAL | 0 refills | Status: DC

## 2022-07-07 DIAGNOSIS — M25442 Effusion, left hand: Secondary | ICD-10-CM | POA: Diagnosis not present

## 2022-07-09 ENCOUNTER — Encounter: Payer: Self-pay | Admitting: Orthopedic Surgery

## 2022-07-11 ENCOUNTER — Ambulatory Visit (INDEPENDENT_AMBULATORY_CARE_PROVIDER_SITE_OTHER): Payer: Medicare HMO | Admitting: Nurse Practitioner

## 2022-07-11 ENCOUNTER — Encounter: Payer: Self-pay | Admitting: Nurse Practitioner

## 2022-07-11 VITALS — BP 138/80 | HR 77 | Temp 97.5°F | Ht 66.0 in | Wt 211.4 lb

## 2022-07-11 DIAGNOSIS — Z23 Encounter for immunization: Secondary | ICD-10-CM

## 2022-07-11 DIAGNOSIS — M159 Polyosteoarthritis, unspecified: Secondary | ICD-10-CM | POA: Diagnosis not present

## 2022-07-11 NOTE — Progress Notes (Signed)
Careteam: Patient Care Team: Lauree Chandler, NP as PCP - General (Geriatric Medicine)  PLACE OF SERVICE:  [place] Directive information    No Known Allergies  Chief Complaint  Patient presents with   Acute Visit    Left elbow and shoulder concerns. Patient fell on 06/19/22 from a ladder and has ongoing hand swelling, elbow and shoulder pain.      HPI: Patient is a 72 y.o. male here for f/u of his L arm. He had fallen off a ladder on 1/25 and fell onto the LUE. He went to urgent care and received a prescription for prednisone. His pain got worse so he came to see [place] on 1/29 for this. L hand was quite swollen with erythema present up to his elbow at least. He was given a prescription for doxycyline and sent for imaging of the hand which showed severe arthritis and soft tissue swelling.    Hand and arm are better today but not fully improved. He went to the urgent care for help again 4 days and they gave him an injection in his hip, and he reports that it is much improved since then but he wants to have [place] check it out as well as the swelling in his hand doesn't seem to be improving but he is able to use hand more and had a full day at work yesterday.  Review of Systems:  Review of Systems  Constitutional:  Negative for chills, fever, malaise/fatigue and weight loss.  HENT:  Negative for congestion and sore throat.   Eyes:  Negative for blurred vision.  Respiratory:  Negative for cough, shortness of breath and wheezing.   Cardiovascular:  Negative for chest pain, palpitations and leg swelling.  Gastrointestinal:  Negative for abdominal pain, blood in stool, constipation, diarrhea, heartburn, nausea and vomiting.  Genitourinary:  Negative for dysuria, frequency, hematuria and urgency.  Musculoskeletal:  Negative for falls and joint pain.       Positive for swelling in L hand  Skin:  Negative for rash.  Neurological:  Negative for dizziness, tingling and headaches.   Endo/Heme/Allergies:  Negative for polydipsia.  Psychiatric/Behavioral:  Negative for depression. The patient is not nervous/anxious.     Past Medical History:  Diagnosis Date   Essential hypertension, benign 03/02/2019   HLD (hyperlipidemia) 03/02/2019   Vitamin D deficiency disease 03/02/2019   No past surgical history on file. Social History:   reports that he quit smoking about 26 years ago. His smoking use included cigarettes. He has a 60.00 pack-year smoking history. He has never used smokeless tobacco. He reports current alcohol use of about 30.0 standard drinks of alcohol per week. He reports that he does not use drugs.  Family History  Problem Relation Age of Onset   Emphysema Mother    Alcohol abuse Father    Emphysema Brother    Healthy Son    Healthy Daughter    Healthy Daughter     Medications: Patient's Medications  New Prescriptions   No medications on file  Previous Medications   ASPIRIN EC 81 MG TABLET    Take 1 tablet (81 mg total) by mouth daily. Swallow whole.   ATORVASTATIN (LIPITOR) 10 MG TABLET    TAKE 1 TABLET BY MOUTH EVERY DAY   CHOLECALCIFEROL (VITAMIN D3) 125 MCG (5000 UT) TABS    Take 1 tablet by mouth daily.   FLUTICASONE (FLONASE) 50 MCG/ACT NASAL SPRAY    Place 1 spray into  both nostrils daily.   HYDROCHLOROTHIAZIDE (HYDRODIURIL) 25 MG TABLET    TAKE 1 TABLET (25 MG TOTAL) BY MOUTH DAILY.   LOSARTAN (COZAAR) 100 MG TABLET    TAKE 1 TABLET BY MOUTH EVERY DAY   OMEGA-3 FATTY ACIDS (FISH OIL PO)    Take 1 capsule by mouth daily.  Modified Medications   No medications on file  Discontinued Medications   DOXYCYCLINE (VIBRA-TABS) 100 MG TABLET    Take 1 tablet (100 mg total) by mouth 2 (two) times daily.   PREDNISONE (STERAPRED UNI-PAK 21 TAB) 10 MG (21) TBPK TABLET    Use as directed    Physical Exam:  Vitals:   07/11/22 1059  BP: 138/80  Pulse: 77  Temp: (!) 97.5 F (36.4 C)  TempSrc: Temporal  SpO2: 97%  Weight: 211 lb 6.4 oz (95.9 kg)   Height: 5' 6"$  (1.676 m)   Body mass index is 34.12 kg/m. Wt Readings from Last 3 Encounters:  07/11/22 211 lb 6.4 oz (95.9 kg)  06/23/22 212 lb 9.6 oz (96.4 kg)  12/16/21 213 lb (96.6 kg)    Physical Exam Vitals and nursing note reviewed. Exam conducted with a chaperone present.  Cardiovascular:     Rate and Rhythm: Normal rate and regular rhythm.     Heart sounds: Normal heart sounds. No murmur heard. Pulmonary:     Breath sounds: Normal breath sounds. No wheezing or rales.  Abdominal:     General: Bowel sounds are normal.     Palpations: Abdomen is soft. There is no mass.     Tenderness: There is no abdominal tenderness. There is no guarding.  Musculoskeletal:        General: Swelling present. No tenderness.     Comments: Swelling present in L hand  Skin:    General: Skin is warm and dry.  Neurological:     Mental Status: He is alert and oriented to person, place, and time. Mental status is at baseline.  Psychiatric:        Mood and Affect: Mood normal.        Behavior: Behavior normal.        Judgment: Judgment normal.    Labs reviewed: Basic Metabolic Panel: Recent Labs    10/25/21 1004 12/03/21 2030 05/30/22 0857  NA 138 137 138  K 4.3 3.5 4.1  CL 100 106 101  CO2 29 22 30  $ GLUCOSE 122 176* 116*  BUN 11 14 14  $ CREATININE 0.75 0.87 0.71  CALCIUM 10.1 9.3 9.5   Liver Function Tests: Recent Labs    10/25/21 1004 05/30/22 0857  AST 29 26  ALT 36 32  BILITOT 0.8 0.6  PROT 7.6 7.4   No results for input(s): "LIPASE", "AMYLASE" in the last 8760 hours. No results for input(s): "AMMONIA" in the last 8760 hours. CBC: Recent Labs    10/25/21 1004 12/03/21 2030 12/16/21 0852 05/30/22 0857  WBC 6.0 14.9* 7.7 5.9  NEUTROABS 3,330  --  4,820 3,162  HGB 15.0 14.4 13.8 14.8  HCT 44.3 41.6 39.8 42.3  MCV 92.7 91.0 91.7 91.2  PLT 203 182 287 174   Lipid Panel: Recent Labs    10/25/21 1004 05/30/22 0857  CHOL 179 185  HDL 53 55  LDLCALC 95 103*   TRIG 224* 158*  CHOLHDL 3.4 3.4   TSH: No results for input(s): "TSH" in the last 8760 hours. A1C: Lab Results  Component Value Date   HGBA1C 5.8 (H) 05/30/2022  Assessment/Plan 1. Need for influenza vaccination Pt received today - Flu Vaccine QUAD High Dose(Fluad)  2. Primary osteoarthritis involving multiple joints Improved, but not yet resolved. Recommended elevating extremity, tylenol as needed. Discussed that if symptoms do not resolve or worsen to return for follow-up.    Return in about 6 months (around 01/09/2023) for routine follow up .  Student- Archer Asa O'Berry ACPCNP-S  I personally was present during the history, physical exam and medical decision-making activities of this service and have verified that the service and findings are accurately documented in the student's note Aisea Bouldin K. [place] [phone]

## 2022-07-11 NOTE — Patient Instructions (Signed)
Elevate hand when sitting and laying down To let us know if pain, redness or swelling worsens

## 2022-10-03 ENCOUNTER — Other Ambulatory Visit: Payer: Self-pay | Admitting: Nurse Practitioner

## 2022-10-03 DIAGNOSIS — I10 Essential (primary) hypertension: Secondary | ICD-10-CM

## 2022-10-05 ENCOUNTER — Other Ambulatory Visit: Payer: Self-pay | Admitting: Nurse Practitioner

## 2022-10-05 DIAGNOSIS — I10 Essential (primary) hypertension: Secondary | ICD-10-CM

## 2022-10-31 ENCOUNTER — Ambulatory Visit (INDEPENDENT_AMBULATORY_CARE_PROVIDER_SITE_OTHER): Payer: Medicare HMO | Admitting: Nurse Practitioner

## 2022-10-31 ENCOUNTER — Encounter: Payer: Self-pay | Admitting: Nurse Practitioner

## 2022-10-31 VITALS — BP 160/64 | HR 84 | Temp 97.4°F | Resp 17 | Ht 66.0 in | Wt 215.8 lb

## 2022-10-31 DIAGNOSIS — Z0000 Encounter for general adult medical examination without abnormal findings: Secondary | ICD-10-CM | POA: Diagnosis not present

## 2022-10-31 NOTE — Patient Instructions (Signed)
  Patrick Nielsen , Thank you for taking time to come for your Medicare Wellness Visit. I appreciate your ongoing commitment to your health goals. Please review the following plan we discussed and let me know if I can assist you in the future.   These are the goals we discussed:  Goals   None     This is a list of the screening recommended for you and due dates:  Health Maintenance  Topic Date Due   Zoster (Shingles) Vaccine (1 of 2) Never done   COVID-19 Vaccine (3 - 2023-24 season) 01/24/2022   Flu Shot  12/25/2022   Medicare Annual Wellness Visit  10/31/2023   Cologuard (Stool DNA test)  05/26/2024   DTaP/Tdap/Td vaccine (2 - Td or Tdap) 03/01/2029   Pneumonia Vaccine  Completed   Hepatitis C Screening  Completed   HPV Vaccine  Aged Out

## 2022-10-31 NOTE — Progress Notes (Signed)
Subjective:   Patrick Nielsen is a 72 y.o. male who presents for Medicare Annual/Subsequent preventive examination.  Review of Systems           Objective:    Today's Vitals   10/31/22 1039 10/31/22 1040  BP: (!) 160/64 (!) 160/64  Pulse: 84   Resp: 17   Temp: (!) 97.4 F (36.3 C)   SpO2: 95%   Weight: 215 lb 12.8 oz (97.9 kg)   Height: 5\' 6"  (1.676 m)    Body mass index is 34.83 kg/m.     10/31/2022   10:41 AM 12/03/2021    7:57 PM 11/22/2021    1:45 PM 10/25/2021    9:40 AM 05/13/2021    2:22 PM 10/16/2020    9:39 AM 09/05/2019    1:29 PM  Advanced Directives  Does Patient Have a Medical Advance Directive? No No No No Yes No No  Does patient want to make changes to medical advance directive?     Yes (MAU/Ambulatory/Procedural Areas - Information given)    Would patient like information on creating a medical advance directive? No - Patient declined No - Patient declined Yes (MAU/Ambulatory/Procedural Areas - Information given) Yes (MAU/Ambulatory/Procedural Areas - Information given)  No - Patient declined Yes (ED - Information included in AVS)    Current Medications (verified) Outpatient Encounter Medications as of 10/31/2022  Medication Sig   aspirin EC 81 MG tablet Take 1 tablet (81 mg total) by mouth daily. Swallow whole.   atorvastatin (LIPITOR) 10 MG tablet TAKE 1 TABLET BY MOUTH EVERY DAY   Cholecalciferol (VITAMIN D3) 125 MCG (5000 UT) TABS Take 1 tablet by mouth daily.   fluticasone (FLONASE) 50 MCG/ACT nasal spray Place 1 spray into both nostrils daily.   hydrochlorothiazide (HYDRODIURIL) 25 MG tablet TAKE 1 TABLET (25 MG TOTAL) BY MOUTH DAILY.   losartan (COZAAR) 100 MG tablet TAKE 1 TABLET BY MOUTH EVERY DAY   Omega-3 Fatty Acids (FISH OIL PO) Take 1 capsule by mouth daily.   No facility-administered encounter medications on file as of 10/31/2022.    Allergies (verified) Patient has no known allergies.   History: Past Medical History:  Diagnosis Date    Essential hypertension, benign 03/02/2019   HLD (hyperlipidemia) 03/02/2019   Vitamin D deficiency disease 03/02/2019   History reviewed. No pertinent surgical history. Family History  Problem Relation Age of Onset   Emphysema Mother    Alcohol abuse Father    Emphysema Brother    Healthy Son    Healthy Daughter    Healthy Daughter    Social History   Socioeconomic History   Marital status: Married    Spouse name: Not on file   Number of children: Not on file   Years of education: Not on file   Highest education level: Not on file  Occupational History   Not on file  Tobacco Use   Smoking status: Former    Packs/day: 1.50    Years: 40.00    Additional pack years: 0.00    Total pack years: 60.00    Types: Cigarettes    Quit date: 05/26/1996    Years since quitting: 26.4   Smokeless tobacco: Never  Vaping Use   Vaping Use: Never used  Substance and Sexual Activity   Alcohol use: Yes    Alcohol/week: 30.0 standard drinks of alcohol    Types: 30 Cans of beer per week   Drug use: No   Sexual activity: Not on file  Other Topics Concern   Not on file  Social History Narrative   Diet: Chicken, Steak and vegetables      Caffeine: Coffee      Married, if yes what year: Married, 1991      Do you live in a house, apartment, assisted living, condo, trailer, ect: House      Is it one or more stories: one      How many persons live in your home? 2      Pets: No      Highest level or education completed: 11th      Current/Past profession: Personnel officer      Exercise:                  Type and how often:          Living Will: No   DNR:   POA/HPOA:      Functional Status:   Do you have difficulty bathing or dressing yourself? No   Do you have difficulty preparing food or eating? No   Do you have difficulty managing your medications? No   Do you have difficulty managing your finances? No   Do you have difficulty affording your medications? No   Social Determinants  of Corporate investment banker Strain: Not on file  Food Insecurity: Not on file  Transportation Needs: Not on file  Physical Activity: Not on file  Stress: Not on file  Social Connections: Not on file    Tobacco Counseling Counseling given: Not Answered   Clinical Intake:                 Diabetic?no         Activities of Daily Living     No data to display           Patient Care Team: [name], NP as PCP - General (Geriatric Medicine)  Indicate any recent Medical Services you may have received from other than [facility] providers in the past year (date may be approximate).     Assessment:   This is a routine wellness examination for Luther.  Hearing/Vision screen Hearing Screening - Comments:: No hearing concerns  Vision Screening - Comments:: No vision concerns. Patient wears prescription glasses. Patient last eye exam June 2023.  Dietary issues and exercise activities discussed:     Goals Addressed   None   Depression Screen    10/31/2022   10:40 AM 10/25/2021    9:38 AM 05/13/2021    2:21 PM 10/16/2020    9:40 AM 01/25/2020    8:04 AM 09/05/2019    1:30 PM  PHQ 2/9 Scores  PHQ - 2 Score 0 0 0 0 0 0  Exception Documentation     Medical reason Medical reason    Fall Risk    10/31/2022   10:40 AM 06/23/2022    1:03 PM 12/16/2021    8:13 AM 11/22/2021    1:44 PM 10/25/2021    9:38 AM  Fall Risk   Falls in the past year? 0 1 1 0 1  Number falls in past yr: 0 0 0 0 0  Injury with Fall? 0 1 0 0 0  Risk for fall due to : No Fall Risks No Fall Risks No Fall Risks No Fall Risks No Fall Risks  Follow up Falls evaluation completed Falls evaluation completed Falls evaluation completed Falls evaluation completed Falls evaluation completed    FALL RISK PREVENTION PERTAINING TO THE HOME:  Any stairs in or around the home? Yes  If so, are there any without handrails? No  Home free of loose throw rugs in walkways, pet beds, electrical cords, etc?  Yes  Adequate lighting in your home to reduce risk of falls? Yes   ASSISTIVE DEVICES UTILIZED TO PREVENT FALLS:  Life alert? No  Use of a cane, walker or w/c? No  Grab bars in the bathroom? No  Shower chair or bench in shower? No  Elevated toilet seat or a handicapped toilet? No   TIMED UP AND GO:  Was the test performed? No .    Cognitive Function:    10/31/2022   10:41 AM 10/25/2021    9:47 AM  MMSE - Mini Mental State Exam  Orientation to time 5 3  Orientation to Place 5 4  Registration 3 3  Attention/ Calculation 0 0  Attention/Calculation-comments  Patient states he can not spell  Recall 3 3  Language- name 2 objects 2 2  Language- repeat 1 1  Language- follow 3 step command 3 3  Language- read & follow direction 1 1  Write a sentence 0 0  Copy design 1 1  Total score 24 21        10/16/2020    9:41 AM 09/05/2019    1:31 PM  6CIT Screen  What Year? 0 points 0 points  What month? 0 points 0 points  What time? 0 points 0 points  Count back from 20 0 points 0 points  Months in reverse 4 points 4 points  Repeat phrase 2 points 10 points  Total Score 6 points 14 points    Immunizations Immunization History  Administered Date(s) Administered   Fluad Quad(high Dose 65+) 02/27/2020, 02/11/2021, 07/11/2022   Influenza-Unspecified 03/02/2019   Pneumococcal Conjugate-13 08/05/2018   Pneumococcal Polysaccharide-23 09/05/2019   Tdap 03/02/2019   Unspecified SARS-COV-2 Vaccination 05/26/2020, 06/26/2020    TDAP status: Up to date  Flu Vaccine status: Up to date  Pneumococcal vaccine status: Up to date  Covid-19 vaccine status: Information provided on how to obtain vaccines.   Qualifies for Shingles Vaccine? Yes   Zostavax completed No   Shingrix Completed?: No.    Education has been provided regarding the importance of this vaccine. Patient has been advised to call insurance company to determine out of pocket expense if they have not yet received this vaccine.  Advised may also receive vaccine at local pharmacy or Health Dept. Verbalized acceptance and understanding.  Screening Tests Health Maintenance  Topic Date Due   Zoster Vaccines- Shingrix (1 of 2) Never done   COVID-19 Vaccine (3 - 2023-24 season) 01/24/2022   INFLUENZA VACCINE  12/25/2022   Medicare Annual Wellness (AWV)  10/31/2023   Fecal DNA (Cologuard)  05/26/2024   DTaP/Tdap/Td (2 - Td or Tdap) 03/01/2029   Pneumonia Vaccine 40+ Years old  Completed   Hepatitis C Screening  Completed   HPV VACCINES  Aged Out    Health Maintenance  Health Maintenance Due  Topic Date Due   Zoster Vaccines- Shingrix (1 of 2) Never done   COVID-19 Vaccine (3 - 2023-24 season) 01/24/2022    Colorectal cancer screening: Type of screening: Cologuard. Completed 05/26/2021. Repeat every 3 years  Lung Cancer Screening: (Low Dose CT Chest recommended if Age 28-80 years, 30 pack-year currently smoking OR have quit w/in 15years.) does not qualify.   Lung Cancer Screening Referral: na  Additional Screening:  Hepatitis C Screening: does qualify; Completed 2020  Vision  Screening: Recommended annual ophthalmology exams for early detection of glaucoma and other disorders of the eye. Is the patient up to date with their annual eye exam?  Yes  Who is the provider or what is the name of the office in which the patient attends annual eye exams? My eye doctor.  If pt is not established with a provider, would they like to be referred to a provider to establish care? No .   Dental Screening: Recommended annual dental exams for proper oral hygiene  Community Resource Referral / Chronic Care Management: CRR required this visit?  No   CCM required this visit?  No      Plan:     I have personally reviewed and noted the following in the patient's chart:   Medical and social history Use of alcohol, tobacco or illicit drugs  Current medications and supplements including opioid prescriptions. Patient is not  currently taking opioid prescriptions. Functional ability and status Nutritional status Physical activity Advanced directives List of other physicians Hospitalizations, surgeries, and ER visits in previous 12 months Vitals Screenings to include cognitive, depression, and falls Referrals and appointments  In addition, I have reviewed and discussed with patient certain preventive protocols, quality metrics, and best practice recommendations. A written personalized care plan for preventive services as well as general preventive health recommendations were provided to patient.     [name], NP   10/31/2022  Place of service: [facility]

## 2022-11-06 ENCOUNTER — Other Ambulatory Visit: Payer: Self-pay | Admitting: Nurse Practitioner

## 2022-11-06 DIAGNOSIS — E782 Mixed hyperlipidemia: Secondary | ICD-10-CM

## 2022-12-01 DIAGNOSIS — H524 Presbyopia: Secondary | ICD-10-CM | POA: Diagnosis not present

## 2023-01-06 ENCOUNTER — Other Ambulatory Visit: Payer: Self-pay | Admitting: Nurse Practitioner

## 2023-01-06 DIAGNOSIS — I10 Essential (primary) hypertension: Secondary | ICD-10-CM

## 2023-01-12 ENCOUNTER — Encounter: Payer: Self-pay | Admitting: Nurse Practitioner

## 2023-01-12 ENCOUNTER — Ambulatory Visit (INDEPENDENT_AMBULATORY_CARE_PROVIDER_SITE_OTHER): Payer: Medicare HMO | Admitting: Nurse Practitioner

## 2023-01-12 VITALS — BP 142/84 | HR 79 | Temp 98.2°F | Ht 66.0 in | Wt 215.4 lb

## 2023-01-12 DIAGNOSIS — I10 Essential (primary) hypertension: Secondary | ICD-10-CM | POA: Diagnosis not present

## 2023-01-12 DIAGNOSIS — R7303 Prediabetes: Secondary | ICD-10-CM | POA: Diagnosis not present

## 2023-01-12 DIAGNOSIS — F1020 Alcohol dependence, uncomplicated: Secondary | ICD-10-CM

## 2023-01-12 DIAGNOSIS — M159 Polyosteoarthritis, unspecified: Secondary | ICD-10-CM

## 2023-01-12 DIAGNOSIS — E669 Obesity, unspecified: Secondary | ICD-10-CM | POA: Diagnosis not present

## 2023-01-12 DIAGNOSIS — J310 Chronic rhinitis: Secondary | ICD-10-CM

## 2023-01-12 DIAGNOSIS — E782 Mixed hyperlipidemia: Secondary | ICD-10-CM

## 2023-01-12 MED ORDER — LOSARTAN POTASSIUM 100 MG PO TABS: 100.0000 mg | ORAL_TABLET | Freq: Every day | ORAL | 1 refills | Status: DC

## 2023-01-12 MED ORDER — BLOOD PRESSURE KIT: PACK | 0 refills | Status: AC

## 2023-01-12 MED ORDER — FLUTICASONE PROPIONATE 50 MCG/ACT NA SUSP: 2.0000 | Freq: Every day | NASAL | 6 refills | Status: DC

## 2023-01-12 NOTE — Progress Notes (Signed)
Careteam: Patient Care Team: [name], NP as PCP - General (Geriatric Medicine)  PLACE OF SERVICE:  [facility]  Advanced Directive information Does Patient Have a Medical Advance Directive?: No, Would patient like information on creating a medical advance directive?: No - Patient declined  No Known Allergies  Chief Complaint  Patient presents with   Medical Management of Chronic Issues    6 month follow-up. Discuss need for covid booster, shingrix, and flu vaccine (not available for documenting in EPIC)      HPI: Patient is a 72 y.o. male for routine follow up.   Doing well since last visit.  He has not had any other falls.  Pain in shoulder and elbow about at 90%.   He drinks 8 beers a day.   He walked for a month but it got too hot.   OA in hands mostly- does not take medication for this  Does not have a bp cuff at home- his broke.   His nose runs for about 2 hours in the morning   Review of Systems:  Review of Systems  Constitutional:  Negative for chills, fever and weight loss.  HENT:  Negative for tinnitus.   Respiratory:  Negative for cough, sputum production and shortness of breath.   Cardiovascular:  Negative for chest pain, palpitations and leg swelling.  Gastrointestinal:  Negative for abdominal pain, constipation, diarrhea and heartburn.  Genitourinary:  Negative for dysuria, frequency and urgency.  Musculoskeletal:  Positive for joint pain. Negative for back pain, falls and myalgias.  Skin: Negative.   Neurological:  Negative for dizziness and headaches.  Psychiatric/Behavioral:  Negative for depression and memory loss. The patient does not have insomnia.     Past Medical History:  Diagnosis Date   Essential hypertension, benign 03/02/2019   HLD (hyperlipidemia) 03/02/2019   Vitamin D deficiency disease 03/02/2019   History reviewed. No pertinent surgical history. Social History:   reports that he quit smoking about 26 years ago. His  smoking use included cigarettes. He started smoking about 66 years ago. He has a 60 pack-year smoking history. He has never used smokeless tobacco. He reports current alcohol use of about 30.0 standard drinks of alcohol per week. He reports that he does not use drugs.  Family History  Problem Relation Age of Onset   Emphysema Mother    Alcohol abuse Father    Emphysema Brother    Healthy Son    Healthy Daughter    Healthy Daughter     Medications: Patient's Medications  New Prescriptions   No medications on file  Previous Medications   ASPIRIN EC 81 MG TABLET    Take 1 tablet (81 mg total) by mouth daily. Swallow whole.   ATORVASTATIN (LIPITOR) 10 MG TABLET    TAKE 1 TABLET BY MOUTH EVERY DAY   CHOLECALCIFEROL (VITAMIN D3) 125 MCG (5000 UT) TABS    Take 1 tablet by mouth daily.   FLUTICASONE (FLONASE) 50 MCG/ACT NASAL SPRAY    Place 1 spray into both nostrils daily.   HYDROCHLOROTHIAZIDE (HYDRODIURIL) 25 MG TABLET    TAKE 1 TABLET (25 MG TOTAL) BY MOUTH DAILY.   LOSARTAN (COZAAR) 100 MG TABLET    TAKE 1 TABLET BY MOUTH EVERY DAY   OMEGA-3 FATTY ACIDS (FISH OIL PO)    Take 1 capsule by mouth daily.  Modified Medications   No medications on file  Discontinued Medications   No medications on file    Physical Exam:  Vitals:   01/12/23 1100 01/12/23 1255  BP: (!) 142/86 (!) 142/84  Pulse: 79   Temp: 98.2 F (36.8 C)   SpO2: 97%   Weight: 215 lb 6.4 oz (97.7 kg)   Height: 5\' 6"  (1.676 m)    Body mass index is 34.77 kg/m. Wt Readings from Last 3 Encounters:  01/12/23 215 lb 6.4 oz (97.7 kg)  10/31/22 215 lb 12.8 oz (97.9 kg)  07/11/22 211 lb 6.4 oz (95.9 kg)    Physical Exam Constitutional:      General: He is not in acute distress.    Appearance: He is well-developed. He is not diaphoretic.  HENT:     Head: Normocephalic and atraumatic.     Right Ear: External ear normal.     Left Ear: External ear normal.     Mouth/Throat:     Pharynx: No oropharyngeal exudate.   Eyes:     Conjunctiva/sclera: Conjunctivae normal.     Pupils: Pupils are equal, round, and reactive to light.  Cardiovascular:     Rate and Rhythm: Normal rate and regular rhythm.     Heart sounds: Normal heart sounds.  Pulmonary:     Effort: Pulmonary effort is normal.     Breath sounds: Normal breath sounds.  Abdominal:     General: Bowel sounds are normal.     Palpations: Abdomen is soft.  Musculoskeletal:        General: No tenderness.     Cervical back: Normal range of motion and neck supple.     Right lower leg: No edema.     Left lower leg: No edema.  Skin:    General: Skin is warm and dry.  Neurological:     Mental Status: He is alert and oriented to person, place, and time.     Labs reviewed: Basic Metabolic Panel: Recent Labs    05/30/22 0857  NA 138  K 4.1  CL 101  CO2 30  GLUCOSE 116*  BUN 14  CREATININE 0.71  CALCIUM 9.5   Liver Function Tests: Recent Labs    05/30/22 0857  AST 26  ALT 32  BILITOT 0.6  PROT 7.4   No results for input(s): "LIPASE", "AMYLASE" in the last 8760 hours. No results for input(s): "AMMONIA" in the last 8760 hours. CBC: Recent Labs    05/30/22 0857  WBC 5.9  NEUTROABS 3,162  HGB 14.8  HCT 42.3  MCV 91.2  PLT 174   Lipid Panel: Recent Labs    05/30/22 0857  CHOL 185  HDL 55  LDLCALC 103*  TRIG 158*  CHOLHDL 3.4   TSH: No results for input(s): "TSH" in the last 8760 hours. A1C: Lab Results  Component Value Date   HGBA1C 5.8 (H) 05/30/2022     Assessment/Plan 1. Essential hypertension, benign -Blood pressure well controlled, goal bp <140/90 Continue current medications and dietary modifications He will take bp at home and report back to office as he has increase bp generally in offices.  follow metabolic panel - losartan (COZAAR) 100 MG tablet; Take 1 tablet (100 mg total) by mouth daily.  Dispense: 90 tablet; Refill: 1 - COMPLETE METABOLIC PANEL WITH GFR - CBC with Differential/Platelet -  Blood Pressure KIT; To check blood pressure weekly and report to office  Dispense: 1 kit; Refill: 0  2. Primary osteoarthritis involving multiple joints Stable at this time  3. Prediabetes -continue dietary modifications.  - Hemoglobin A1c  4. Mixed hyperlipidemia -continues on Lipitor with dietary  modifications  - COMPLETE METABOLIC PANEL WITH GFR  5. Obesity (BMI 30-39.9) -education provided on healthy weight loss through increase in physical activity and proper nutrition  -decrease etoh consumption   6. Rhinitis, unspecified type - fluticasone (FLONASE) 50 MCG/ACT nasal spray; Place 2 sprays into both nostrils daily.  Dispense: 16 g; Refill: 6  7. Uncomplicated alcohol dependence (HCC) Encouraged to cut back from ETOH and cessation is goal    Return in about 6 months (around 07/15/2023) for routine follow up .  Janene Harvey. [facility]ag[facility] [phone]

## 2023-01-12 NOTE — Patient Instructions (Signed)
To check blood pressure 1 hour AFTER you have had your medication Make sure you have been sitting at least 5 mins  Record and let us know.  Goal <140/90  Blood pressure kit prescription sent to pharmacy

## 2023-01-13 LAB — COMPLETE METABOLIC PANEL WITH GFR
AG Ratio: 1.5 (calc) (ref 1.0–2.5)
ALT: 35 U/L (ref 9–46)
AST: 26 U/L (ref 10–35)
Albumin: 4.4 g/dL (ref 3.6–5.1)
Alkaline phosphatase (APISO): 70 U/L (ref 35–144)
BUN: 17 mg/dL (ref 7–25)
CO2: 27 mmol/L (ref 20–32)
Calcium: 10.2 mg/dL (ref 8.6–10.3)
Chloride: 101 mmol/L (ref 98–110)
Creat: 0.82 mg/dL (ref 0.70–1.28)
Globulin: 3 g/dL (ref 1.9–3.7)
Glucose, Bld: 98 mg/dL (ref 65–139)
Potassium: 4.1 mmol/L (ref 3.5–5.3)
Sodium: 139 mmol/L (ref 135–146)
Total Bilirubin: 0.5 mg/dL (ref 0.2–1.2)
Total Protein: 7.4 g/dL (ref 6.1–8.1)
eGFR: 94 mL/min/{1.73_m2} (ref >=60)

## 2023-01-13 LAB — CBC WITH DIFFERENTIAL/PLATELET
Absolute Monocytes: 649 {cells}/uL (ref 200–950)
Basophils Absolute: 0 {cells}/uL (ref 0–200)
Basophils Relative: 0 %
Eosinophils Absolute: 82 cells/uL (ref 15–500)
Eosinophils Relative: 1.9 %
HCT: 41.5 % (ref 38.5–50.0)
Hemoglobin: 14.6 g/dL (ref 13.2–17.1)
Lymphs Abs: 1178 {cells}/uL (ref 850–3900)
MCH: 32.7 pg (ref 27.0–33.0)
MCHC: 35.2 g/dL (ref 32.0–36.0)
MCV: 93 fL (ref 80.0–100.0)
MPV: 10.8 fL (ref 7.5–12.5)
Monocytes Relative: 15.1 %
Neutro Abs: 2391 {cells}/uL (ref 1500–7800)
Neutrophils Relative %: 55.6 %
Platelets: 227 10*3/uL (ref 140–400)
RBC: 4.46 10*6/uL (ref 4.20–5.80)
RDW: 13 % (ref 11.0–15.0)
Total Lymphocyte: 27.4 %
WBC: 4.3 10*3/uL (ref 3.8–10.8)

## 2023-01-13 LAB — HEMOGLOBIN A1C
Hgb A1c MFr Bld: 5.7 %{Hb} — ABNORMAL HIGH (ref <5.7)
Mean Plasma Glucose: 117 mg/dL
eAG (mmol/L): 6.5 mmol/L

## 2023-02-08 ENCOUNTER — Other Ambulatory Visit: Payer: Self-pay | Admitting: Nurse Practitioner

## 2023-02-08 DIAGNOSIS — E782 Mixed hyperlipidemia: Secondary | ICD-10-CM

## 2023-04-11 ENCOUNTER — Other Ambulatory Visit: Payer: Self-pay | Admitting: Nurse Practitioner

## 2023-04-11 DIAGNOSIS — I10 Essential (primary) hypertension: Secondary | ICD-10-CM

## 2023-07-06 ENCOUNTER — Other Ambulatory Visit: Payer: Self-pay | Admitting: Nurse Practitioner

## 2023-07-06 DIAGNOSIS — J310 Chronic rhinitis: Secondary | ICD-10-CM

## 2023-07-07 ENCOUNTER — Other Ambulatory Visit: Payer: Self-pay | Admitting: Nurse Practitioner

## 2023-07-07 DIAGNOSIS — E782 Mixed hyperlipidemia: Secondary | ICD-10-CM

## 2023-07-13 ENCOUNTER — Ambulatory Visit: Payer: Medicare HMO | Admitting: Nurse Practitioner

## 2023-08-10 ENCOUNTER — Encounter: Payer: Self-pay | Admitting: Nurse Practitioner

## 2023-08-10 ENCOUNTER — Ambulatory Visit (INDEPENDENT_AMBULATORY_CARE_PROVIDER_SITE_OTHER): Payer: Medicare HMO | Admitting: Nurse Practitioner

## 2023-08-10 VITALS — BP 138/80 | HR 84 | Temp 97.1°F | Ht 66.0 in | Wt 214.0 lb

## 2023-08-10 DIAGNOSIS — R7303 Prediabetes: Secondary | ICD-10-CM | POA: Diagnosis not present

## 2023-08-10 DIAGNOSIS — B351 Tinea unguium: Secondary | ICD-10-CM

## 2023-08-10 DIAGNOSIS — E782 Mixed hyperlipidemia: Secondary | ICD-10-CM

## 2023-08-10 DIAGNOSIS — I10 Essential (primary) hypertension: Secondary | ICD-10-CM

## 2023-08-10 DIAGNOSIS — M150 Primary generalized (osteo)arthritis: Secondary | ICD-10-CM

## 2023-08-10 DIAGNOSIS — J310 Chronic rhinitis: Secondary | ICD-10-CM

## 2023-08-10 DIAGNOSIS — F1020 Alcohol dependence, uncomplicated: Secondary | ICD-10-CM | POA: Diagnosis not present

## 2023-08-10 DIAGNOSIS — E669 Obesity, unspecified: Secondary | ICD-10-CM | POA: Diagnosis not present

## 2023-08-10 DIAGNOSIS — M79642 Pain in left hand: Secondary | ICD-10-CM | POA: Diagnosis not present

## 2023-08-10 NOTE — Progress Notes (Signed)
 Careteam: Patient Care Team: [name], NP as PCP - General (Geriatric Medicine)  PLACE OF SERVICE:  [facility]  Advanced Directive information Does Patient Have a Medical Advance Directive?: No, Would patient like information on creating a medical advance directive?: No - Patient declined  No Known Allergies  Chief Complaint  Patient presents with   Medical Management of Chronic Issues    6 month follow-up. Dicsussed need for covid booster (refused) and shingrix.      HPI: Patient is a 73 y.o. male who presents for his 6 month follow up chronic medical management visit. He is fasting today in anticipation of lab work. His last lipid panel was checked in January 2024.   His last A1C was 5.7 in August 2024. He has not changed anything about his diet, mentions that he will in the future. He eats 2 large meals a day, breakfast and supper and drinks water mostly in the morning and at night. He continues to drink 6-7 beers daily. His weight is unchanged since last visit.   He continues to take medications as prescribed and has been keeping a weekly log of his blood pressure readings. His home BP readings are in range and have improved.  He continues to have rhinorrhea in the mornings- finding some relief from use of Flonase but not much.    His left hand and left shoulder pain have improved, he is currently not taking anything for this.   He denies any chest pain, shortness of breath, leg swelling, headaches, vision changes, recent falls, and changes in bowel or bladder function. He mentions he had gotten food poisoning a couple of months ago which has self resolved.     Review of Systems:  Review of Systems  Constitutional: Negative.   HENT:         Persistent rhinorrhea every morning  Eyes: Negative.   Respiratory: Negative.    Cardiovascular: Negative.   Gastrointestinal: Negative.        Occasional heartburn  Genitourinary: Negative.   Musculoskeletal: Negative.    Skin: Negative.        Dry, brittle finger nails  Neurological: Negative.   Psychiatric/Behavioral: Negative.      Past Medical History:  Diagnosis Date   Essential hypertension, benign 03/02/2019   HLD (hyperlipidemia) 03/02/2019   Vitamin D deficiency disease 03/02/2019   History reviewed. No pertinent surgical history. Social History:   reports that he quit smoking about 27 years ago. His smoking use included cigarettes. He started smoking about 67 years ago. He has a 60 pack-year smoking history. He has never used smokeless tobacco. He reports current alcohol use of about 30.0 standard drinks of alcohol per week. He reports that he does not use drugs.  Family History  Problem Relation Age of Onset   Emphysema Mother    Alcohol abuse Father    Emphysema Brother    Healthy Son    Healthy Daughter    Healthy Daughter     Medications: Patient's Medications  New Prescriptions   No medications on file  Previous Medications   ASPIRIN EC 81 MG TABLET    Take 1 tablet (81 mg total) by mouth daily. Swallow whole.   ATORVASTATIN (LIPITOR) 10 MG TABLET    TAKE 1 TABLET BY MOUTH EVERY DAY   BLOOD PRESSURE KIT    To check blood pressure weekly and report to office   CHOLECALCIFEROL (VITAMIN D3) 125 MCG (5000 UT) TABS    Take 1  tablet by mouth daily.   FLUTICASONE (FLONASE) 50 MCG/ACT NASAL SPRAY    SPRAY 2 SPRAYS INTO EACH NOSTRIL EVERY DAY   HYDROCHLOROTHIAZIDE (HYDRODIURIL) 25 MG TABLET    TAKE 1 TABLET (25 MG TOTAL) BY MOUTH DAILY.   LOSARTAN (COZAAR) 100 MG TABLET    Take 1 tablet (100 mg total) by mouth daily.   OMEGA-3 FATTY ACIDS (FISH OIL PO)    Take 1 capsule by mouth daily.  Modified Medications   No medications on file  Discontinued Medications   No medications on file    Physical Exam:  Vitals:   08/10/23 1036 08/10/23 1050  BP: (!) 140/82 138/80  Pulse: 84   Temp: (!) 97.1 F (36.2 C)   TempSrc: Temporal   SpO2: 98%   Weight: 214 lb (97.1 kg)   Height: 5\' 6"   (1.676 m)    Body mass index is 34.54 kg/m. Wt Readings from Last 3 Encounters:  08/10/23 214 lb (97.1 kg)  01/12/23 215 lb 6.4 oz (97.7 kg)  10/31/22 215 lb 12.8 oz (97.9 kg)    Physical Exam Vitals reviewed.  Constitutional:      Appearance: Normal appearance. He is obese.  HENT:     Head: Normocephalic and atraumatic.     Right Ear: External ear normal.     Left Ear: External ear normal.     Nose: Nose normal.     Mouth/Throat:     Mouth: Mucous membranes are moist.     Pharynx: Oropharynx is clear.  Eyes:     Conjunctiva/sclera: Conjunctivae normal.  Cardiovascular:     Rate and Rhythm: Rhythm regularly irregular.  Pulmonary:     Effort: Pulmonary effort is normal.     Breath sounds: Normal breath sounds.  Abdominal:     General: Bowel sounds are increased. There is distension.     Palpations: Abdomen is soft.  Musculoskeletal:        General: Normal range of motion.     Cervical back: Neck supple.  Skin:    General: Skin is warm and dry.     Comments: Fungal infection on fingernails, more noticeable on bilateral pollex  Neurological:     General: No focal deficit present.     Mental Status: He is alert and oriented to person, place, and time.  Psychiatric:        Mood and Affect: Mood normal.        Behavior: Behavior normal.     Labs reviewed: Basic Metabolic Panel: Recent Labs    01/12/23 1331  NA 139  K 4.1  CL 101  CO2 27  GLUCOSE 98  BUN 17  CREATININE 0.82  CALCIUM 10.2   Liver Function Tests: Recent Labs    01/12/23 1331  AST 26  ALT 35  BILITOT 0.5  PROT 7.4   No results for input(s): "LIPASE", "AMYLASE" in the last 8760 hours. No results for input(s): "AMMONIA" in the last 8760 hours. CBC: Recent Labs    01/12/23 1331  WBC 4.3  NEUTROABS 2,391  HGB 14.6  HCT 41.5  MCV 93.0  PLT 227   Lipid Panel: No results for input(s): "CHOL", "HDL", "LDLCALC", "TRIG", "CHOLHDL", "LDLDIRECT" in the last 8760 hours. TSH: No results  for input(s): "TSH" in the last 8760 hours. A1C: Lab Results  Component Value Date   HGBA1C 5.7 (H) 01/12/2023     Assessment/Plan 1. Essential hypertension, benign (Primary) - BP 138/80 in clinic today, home readings greatly improved  and well controlled  - Continue losartan and hydrochlorothiazide as prescribed - = check BP in evenings only and record weekly, bring log to next visit - COMPLETE METABOLIC PANEL WITH GFR ordered to be completed in clinic today - CBC with Differential/Platelet ordered to be completed in clinic today - Discussed benefits of low sodium heart healthy diet  2. Prediabetes - Last A1C 5.7 - Discussed dietary modifications, including decreasing daily alcohol intake  - Hemoglobin A1c ordered to be completed in clinic today  3. Primary osteoarthritis involving multiple joints - Stable, able to move left shoulder to 90%  - No complaints, denies taking anything for pain control  4. Left hand pain - Resolved  5. Mixed hyperlipidemia - Last lipid panel January 2024 - Lipid panel ordered to be completed in clinic today - COMPLETE METABOLIC PANEL WITH GFR ordered to be completed in clinic today - Discussed heart healthy diet and benefits of physical activity - Continue atorvastatin 10 mg tablet as prescribed   6. Onchomycosis - Fungal infection to bilateral pollex with no complications - Patient denies any discomfort due to fungal infection of nails - Patient advised on treatment options and potential problems with oral treatment and concurrent alcohol use  7. Obesity (BMI 30-39.9) - Weight steady, 214 lb today - Discussed importance of increasing physical activity - Discussed dietary changes optimal for weight loss: heart healthy, low sodium, and low calorie foods  8. Uncomplicated alcohol dependence (HCC) - He continues to drink about 7 beers daily - Discussed setting easy to maintain goals, including decreasing 1 beer a day every week to 2  weeks  9. Rhinitis, unspecified type - Continue using Flonase every morning  - Discussed trying over the counter loratadine for rhinitis  Follow up in 6 months with labs.  Lenord Fellers, RN DNP-AGPCNP Student -I personally was present during the history, physical exam and medical decision-making activities of this service and have verified that the service and findings are accurately documented in the student's note Heidy Mccubbin K. Biagio Borg [facility] [phone]

## 2023-08-11 ENCOUNTER — Telehealth: Payer: Self-pay

## 2023-08-11 LAB — COMPLETE METABOLIC PANEL WITH GFR
AG Ratio: 1.4 (calc) (ref 1.0–2.5)
ALT: 38 U/L (ref 9–46)
AST: 27 U/L (ref 10–35)
Albumin: 4.4 g/dL (ref 3.6–5.1)
Alkaline phosphatase (APISO): 67 U/L (ref 35–144)
BUN: 16 mg/dL (ref 7–25)
CO2: 28 mmol/L (ref 20–32)
Calcium: 9.9 mg/dL (ref 8.6–10.3)
Chloride: 101 mmol/L (ref 98–110)
Creat: 0.8 mg/dL (ref 0.70–1.28)
Globulin: 3.2 g/dL (ref 1.9–3.7)
Glucose, Bld: 124 mg/dL — ABNORMAL HIGH (ref 65–99)
Potassium: 4.5 mmol/L (ref 3.5–5.3)
Sodium: 139 mmol/L (ref 135–146)
Total Bilirubin: 0.6 mg/dL (ref 0.2–1.2)
Total Protein: 7.6 g/dL (ref 6.1–8.1)
eGFR: 95 mL/min/{1.73_m2} (ref >=60)

## 2023-08-11 LAB — CBC WITH DIFFERENTIAL/PLATELET
Absolute Lymphocytes: 1623 {cells}/uL (ref 850–3900)
Absolute Monocytes: 799 {cells}/uL (ref 200–950)
Basophils Absolute: 31 {cells}/uL (ref 0–200)
Basophils Relative: 0.5 %
Eosinophils Absolute: 67 {cells}/uL (ref 15–500)
Eosinophils Relative: 1.1 %
HCT: 43.4 % (ref 38.5–50.0)
Hemoglobin: 14.9 g/dL (ref 13.2–17.1)
MCH: 32 pg (ref 27.0–33.0)
MCHC: 34.3 g/dL (ref 32.0–36.0)
MCV: 93.1 fL (ref 80.0–100.0)
MPV: 11 fL (ref 7.5–12.5)
Monocytes Relative: 13.1 %
Neutro Abs: 3581 {cells}/uL (ref 1500–7800)
Neutrophils Relative %: 58.7 %
Platelets: 202 10*3/uL (ref 140–400)
RBC: 4.66 10*6/uL (ref 4.20–5.80)
RDW: 12.6 % (ref 11.0–15.0)
Total Lymphocyte: 26.6 %
WBC: 6.1 10*3/uL (ref 3.8–10.8)

## 2023-08-11 LAB — LIPID PANEL
Cholesterol: 188 mg/dL (ref <200)
HDL: 52 mg/dL (ref >=40)
LDL Cholesterol (Calc): 101 mg/dL — ABNORMAL HIGH
Non-HDL Cholesterol (Calc): 136 mg/dL — ABNORMAL HIGH (ref <130)
Total CHOL/HDL Ratio: 3.6 (calc) (ref <5.0)
Triglycerides: 233 mg/dL — ABNORMAL HIGH (ref <150)

## 2023-08-11 LAB — HEMOGLOBIN A1C
Hgb A1c MFr Bld: 5.9 %{Hb} — ABNORMAL HIGH (ref <5.7)
Mean Plasma Glucose: 123 mg/dL
eAG (mmol/L): 6.8 mmol/L

## 2023-08-11 NOTE — Telephone Encounter (Signed)
 Copied from CRM [id number]. Topic: Clinical - Lab/Test Results >> Aug 11, 2023 11:24 AM Irine Seal wrote: Reason for CRM: patient returning missed call from Genice Rouge, CMA regarding lab results, I read provider message verbatim, patient verbalized understanding and had no additional questions.  Patient is requesting for his lab results to be mailed to his mailing address on file, due to his mychart access still pending he is unable to view them online.  Spoke with patient and has agreed that lab results will be mailed to the patient home.

## 2023-10-04 ENCOUNTER — Other Ambulatory Visit: Payer: Self-pay | Admitting: Nurse Practitioner

## 2023-10-04 DIAGNOSIS — I10 Essential (primary) hypertension: Secondary | ICD-10-CM

## 2023-11-02 ENCOUNTER — Encounter: Payer: Medicare HMO | Admitting: Nurse Practitioner

## 2023-12-21 ENCOUNTER — Ambulatory Visit: Payer: Self-pay

## 2023-12-21 NOTE — Telephone Encounter (Signed)
 YesFYI Only or Action Required?: FYI only for provider.  Patient was last seen in primary care on 08/10/2023 by Caro Harlene POUR, NP.  Called Nurse Triage reporting Knee Pain.  Symptoms began several years ago.  Interventions attempted: OTC medications: tylenol  and Ice/heat application.  Symptoms are: gradually worsening.  Triage Disposition: See PCP Within 2 Weeks  Patient/caregiver understands and will follow disposition?: Yes             Copied from CRM [id number]. Topic: Clinical - Red Word Triage >> Dec 21, 2023 12:38 PM Miquel SAILOR wrote: Red Word that prompted transfer to Nurse Triage: RT Knee pain/Check out LT knee since 6-33yrs for 1 week been acting up morethen usually Reason for Disposition  [1] MILD pain (e.g., does not interfere with normal activities) AND [2] present > 7 days  Answer Assessment - Initial Assessment Questions 1. LOCATION and RADIATION: Where is the pain located?      Right knee -front  2. QUALITY: What does the pain feel like?  (e.g., sharp, dull, aching, burning)     Sharp pain when he turns over, stops when he laying on his back 3. SEVERITY: How bad is the pain? What does it keep you from doing?   (Scale 1-10; or mild, moderate, severe)     1/10- right now but hurts worst at night like 5/10 when in bed.  4. ONSET: When did the pain start? Does it come and go, or is it there all the time?     6-7 yrs ago  5. RECURRENT: Have you had this pain before? If Yes, ask: When, and what happened then?     no 6. SETTING: Has there been any recent work, exercise or other activity that involved that part of the body?      Had work injury years ago  7. AGGRAVATING FACTORS: What makes the knee pain worse? (e.g., walking, climbing stairs, running)      8. ASSOCIATED SYMPTOMS: Is there any swelling or redness of the knee?    Slight swelling 9. OTHER SYMPTOMS: Do you have any other symptoms? (e.g., calf pain, chest pain, difficulty  breathing, fever)    no  Protocols used: Knee Pain-A-AH

## 2023-12-21 NOTE — Telephone Encounter (Signed)
 Patient has a scheduled appointment on 7/31.

## 2023-12-24 ENCOUNTER — Encounter: Payer: Self-pay | Admitting: Adult Health

## 2023-12-24 ENCOUNTER — Ambulatory Visit (SKILLED_NURSING_FACILITY): Admitting: Adult Health

## 2023-12-24 VITALS — BP 130/52 | HR 77 | Temp 97.9°F | Ht 66.0 in | Wt 216.4 lb

## 2023-12-24 DIAGNOSIS — M25561 Pain in right knee: Secondary | ICD-10-CM | POA: Diagnosis not present

## 2023-12-24 NOTE — Progress Notes (Signed)
 Location:  [facility]   Place of Service:   [facility]   CODE STATUS:   No Known Allergies  Chief Complaint  Patient presents with   Knee Pain    Right knee pain going on for approximately 6 years on and off due to a work accident. Throbbing pain on inside of knee while walking and sleeping. Some mild swelling at times. Patient has been using ibuprofen  and states that it's not really helping.     HPI:  The pain has been going on for the past 6 months the pain is throbbing in nature, no radiation present. Does have some pins and needles present. Does hurt with movement. Has been taking ibuprofen  without relief of pain. Also took tylenol  without relief. Does have some popping on bending knee. There is slight swelling present.   Past Medical History:  Diagnosis Date   Essential hypertension, benign 03/02/2019   HLD (hyperlipidemia) 03/02/2019   Vitamin D deficiency disease 03/02/2019    History reviewed. No pertinent surgical history.  Social History   Socioeconomic History   Marital status: Married    Spouse name: Not on file   Number of children: Not on file   Years of education: Not on file   Highest education level: Not on file  Occupational History   Not on file  Tobacco Use   Smoking status: Former    Current packs/day: 0.00    Average packs/day: 1.5 packs/day for 40.0 years (60.0 ttl pk-yrs)    Types: Cigarettes    Start date: 05/26/1956    Quit date: 05/26/1996    Years since quitting: 27.5   Smokeless tobacco: Never  Vaping Use   Vaping status: Never Used  Substance and Sexual Activity   Alcohol use: Yes    Alcohol/week: 30.0 standard drinks of alcohol    Types: 30 Cans of beer per week   Drug use: No   Sexual activity: Not on file  Other Topics Concern   Not on file  Social History Narrative   Diet: Chicken, Steak and vegetables      Caffeine: Coffee      Married, if yes what year: Married, 1991      Do you live in a house, apartment, assisted  living, condo, trailer, ect: House      Is it one or more stories: one      How many persons live in your home? 2      Pets: No      Highest level or education completed: 11th      Current/Past profession: Personnel officer      Exercise:                  Type and how often:          Living Will: No   DNR:   POA/HPOA:      Functional Status:   Do you have difficulty bathing or dressing yourself? No   Do you have difficulty preparing food or eating? No   Do you have difficulty managing your medications? No   Do you have difficulty managing your finances? No   Do you have difficulty affording your medications? No   Social Drivers of Corporate investment banker Strain: Not on file  Food Insecurity: Not on file  Transportation Needs: Not on file  Physical Activity: Not on file  Stress: Not on file  Social Connections: Not on file  Intimate Partner Violence: Not on file  Family History  Problem Relation Age of Onset   Emphysema Mother    Alcohol abuse Father    Emphysema Brother    Healthy Son    Healthy Daughter    Healthy Daughter       VITAL SIGNS BP (!) 130/52 Comment: Left arm  Pulse 77   Temp 97.9 F (36.6 C)   Ht 5' 6 (1.676 m)   Wt 216 lb 6.4 oz (98.2 kg)   SpO2 96%   BMI 34.93 kg/m   Outpatient Encounter Medications as of 12/24/2023  Medication Sig   aspirin  EC 81 MG tablet Take 1 tablet (81 mg total) by mouth daily. Swallow whole.   atorvastatin  (LIPITOR) 10 MG tablet TAKE 1 TABLET BY MOUTH EVERY DAY   Blood Pressure KIT To check blood pressure weekly and report to office   Cholecalciferol (VITAMIN D3) 125 MCG (5000 UT) TABS Take 1 tablet by mouth daily.   fluticasone  (FLONASE ) 50 MCG/ACT nasal spray SPRAY 2 SPRAYS INTO EACH NOSTRIL EVERY DAY   hydrochlorothiazide  (HYDRODIURIL ) 25 MG tablet TAKE 1 TABLET (25 MG TOTAL) BY MOUTH DAILY.   losartan  (COZAAR ) 100 MG tablet TAKE 1 TABLET BY MOUTH EVERY DAY   Omega-3 Fatty Acids (FISH OIL PO) Take 1 capsule  by mouth daily. (Patient not taking: Reported on 12/24/2023)   No facility-administered encounter medications on file as of 12/24/2023.     SIGNIFICANT DIAGNOSTIC EXAMS  Review of Systems  Constitutional:  Negative for malaise/fatigue.  Musculoskeletal:  Positive for joint pain.       Right knee pain     Physical Exam Constitutional:      Appearance: He is overweight.  Musculoskeletal:        General: Swelling present. Normal range of motion.     Right lower leg: No edema.     Left lower leg: No edema.     Comments: Has mild swelling present right medial knee; has full range of motion; has popping with bending knee (has had this for long term)  Neurological:     Mental Status: He is alert and oriented to person, place, and time.  Psychiatric:        Mood and Affect: Mood normal.      ASSESSMENT/ PLAN:  TODAY  1 right knee pain: will begin aleve  2 tabs twice daily with meals; will use ice as needed to right knee; will get an x-ray of right knee.    Barnie Seip NP [facility]   call [phone]

## 2023-12-24 NOTE — Patient Instructions (Signed)
 Take over the counter aleve  2 tablets twice daily with meals Use ice pack as needed for pain.

## 2023-12-31 ENCOUNTER — Other Ambulatory Visit: Payer: Self-pay | Admitting: Nurse Practitioner

## 2023-12-31 DIAGNOSIS — E782 Mixed hyperlipidemia: Secondary | ICD-10-CM

## 2024-02-11 NOTE — Patient Instructions (Signed)
 1.) Schedule an annual wellness visit at check out.

## 2024-02-12 ENCOUNTER — Encounter: Payer: Self-pay | Admitting: Nurse Practitioner

## 2024-02-12 ENCOUNTER — Ambulatory Visit (INDEPENDENT_AMBULATORY_CARE_PROVIDER_SITE_OTHER): Admitting: Nurse Practitioner

## 2024-02-12 VITALS — BP 136/82 | HR 83 | Temp 97.3°F | Ht 66.0 in | Wt 214.0 lb

## 2024-02-12 DIAGNOSIS — E782 Mixed hyperlipidemia: Secondary | ICD-10-CM

## 2024-02-12 DIAGNOSIS — E669 Obesity, unspecified: Secondary | ICD-10-CM | POA: Diagnosis not present

## 2024-02-12 DIAGNOSIS — Z23 Encounter for immunization: Secondary | ICD-10-CM

## 2024-02-12 DIAGNOSIS — I10 Essential (primary) hypertension: Secondary | ICD-10-CM | POA: Diagnosis not present

## 2024-02-12 DIAGNOSIS — M150 Primary generalized (osteo)arthritis: Secondary | ICD-10-CM | POA: Insufficient documentation

## 2024-02-12 DIAGNOSIS — R7303 Prediabetes: Secondary | ICD-10-CM | POA: Diagnosis not present

## 2024-02-12 MED ORDER — DICLOFENAC SODIUM 1 % EX GEL: 4.0000 g | Freq: Four times a day (QID) | CUTANEOUS | 1 refills | Status: AC

## 2024-02-12 NOTE — Assessment & Plan Note (Signed)
 Continues on lipitor and fish oil Encouraged dietary modifications Follow up lipids

## 2024-02-12 NOTE — Assessment & Plan Note (Signed)
 Continue dietary modifications

## 2024-02-12 NOTE — Progress Notes (Signed)
 Careteam: Patient Care Team: Caro Harlene POUR, NP as PCP - General (Geriatric Medicine)  PLACE OF SERVICE:  [facility]  Advanced Directive information    No Known Allergies  Chief Complaint  Patient presents with   Medical Management of Chronic Issues    6 month follow-up. Discussed need for covid booster and shingrix (declined). Flu vaccine today. Patient to schedule annual wellness visit at checkout.     HPI:  Discussed the use of AI scribe software for clinical note transcription with the patient, who gave verbal consent to proceed.  History of Present Illness Patrick Nielsen is a 73 year old male who presents for a six-month follow-up.  He has experienced significant improvement in knee pain, describing it as 'ninety percent better' since the last visit. He reports that another nurse practitioner told him he has arthritis and recommended taking Advil  twice daily with meals. He has since discontinued Advil  and reports no current knee pain. He engages in daily physical activity, including climbing, as part of his work as an Personnel officer.  He is currently taking Lipitor 10 mg daily for high cholesterol. He also takes fish oil and aspirin . He has a history of prediabetes.  hydrochlorothiazide  25 mg, and losartan  100 mg for blood pressure management.   No shortness of breath, chest pain, palpitations, difficulty chewing or swallowing, changes in bowel movements, or changes in urination. No other joint aches or pains.  He mentions a recent incident where his dog nipped at him, causing a minor skin break, which he treated with liquid skin. He reports no signs of infection.   Review of Systems:  Review of Systems  Constitutional:  Negative for chills, fever and weight loss.  HENT:  Negative for tinnitus.   Respiratory:  Negative for cough, sputum production and shortness of breath.   Cardiovascular:  Negative for chest pain, palpitations and leg swelling.  Gastrointestinal:   Negative for abdominal pain, constipation, diarrhea and heartburn.  Genitourinary:  Negative for dysuria, frequency and urgency.  Musculoskeletal:  Negative for back pain, falls, joint pain and myalgias.  Skin: Negative.   Neurological:  Negative for dizziness and headaches.  Psychiatric/Behavioral:  Negative for depression and memory loss. The patient does not have insomnia.     Past Medical History:  Diagnosis Date   Essential hypertension, benign 03/02/2019   HLD (hyperlipidemia) 03/02/2019   Vitamin D deficiency disease 03/02/2019   History reviewed. No pertinent surgical history. Social History:   reports that he quit smoking about 27 years ago. His smoking use included cigarettes. He started smoking about 67 years ago. He has a 60 pack-year smoking history. He has never used smokeless tobacco. He reports current alcohol use of about 30.0 standard drinks of alcohol per week. He reports that he does not use drugs.  Family History  Problem Relation Age of Onset   Emphysema Mother    Alcohol abuse Father    Emphysema Brother    Healthy Son    Healthy Daughter    Healthy Daughter     Medications: Patient's Medications  New Prescriptions   No medications on file  Previous Medications   ASPIRIN  EC 81 MG TABLET    Take 1 tablet (81 mg total) by mouth daily. Swallow whole.   ATORVASTATIN  (LIPITOR) 10 MG TABLET    TAKE 1 TABLET BY MOUTH EVERY DAY   BLOOD PRESSURE KIT    To check blood pressure weekly and report to office   CHOLECALCIFEROL (VITAMIN D3) 125  MCG (5000 UT) TABS    Take 1 tablet by mouth daily.   FLUTICASONE  (FLONASE ) 50 MCG/ACT NASAL SPRAY    SPRAY 2 SPRAYS INTO EACH NOSTRIL EVERY DAY   HYDROCHLOROTHIAZIDE  (HYDRODIURIL ) 25 MG TABLET    TAKE 1 TABLET (25 MG TOTAL) BY MOUTH DAILY.   LOSARTAN  (COZAAR ) 100 MG TABLET    TAKE 1 TABLET BY MOUTH EVERY DAY   OMEGA-3 FATTY ACIDS (FISH OIL PO)    Take 1 capsule by mouth daily.  Modified Medications   No medications on file   Discontinued Medications   No medications on file    Physical Exam:  Vitals:   02/12/24 1029  BP: 136/82  Pulse: 83  Temp: (!) 97.3 F (36.3 C)  SpO2: 97%  Weight: 214 lb (97.1 kg)  Height: 5' 6 (1.676 m)   Body mass index is 34.54 kg/m. Wt Readings from Last 3 Encounters:  02/12/24 214 lb (97.1 kg)  12/24/23 216 lb 6.4 oz (98.2 kg)  08/10/23 214 lb (97.1 kg)    Physical Exam Constitutional:      General: He is not in acute distress.    Appearance: He is well-developed. He is not diaphoretic.  HENT:     Head: Normocephalic and atraumatic.     Right Ear: External ear normal.     Left Ear: External ear normal.     Mouth/Throat:     Pharynx: No oropharyngeal exudate.  Eyes:     Conjunctiva/sclera: Conjunctivae normal.     Pupils: Pupils are equal, round, and reactive to light.  Cardiovascular:     Rate and Rhythm: Normal rate and regular rhythm.     Heart sounds: Normal heart sounds.  Pulmonary:     Effort: Pulmonary effort is normal.     Breath sounds: Normal breath sounds.  Abdominal:     General: Bowel sounds are normal.     Palpations: Abdomen is soft.  Musculoskeletal:        General: No tenderness.     Cervical back: Normal range of motion and neck supple.     Right lower leg: No edema.     Left lower leg: No edema.  Skin:    General: Skin is warm and dry.  Neurological:     Mental Status: He is alert and oriented to person, place, and time.     Labs reviewed: Basic Metabolic Panel: Recent Labs    08/10/23 1134  NA 139  K 4.5  CL 101  CO2 28  GLUCOSE 124*  BUN 16  CREATININE 0.80  CALCIUM  9.9   Liver Function Tests: Recent Labs    08/10/23 1134  AST 27  ALT 38  BILITOT 0.6  PROT 7.6   No results for input(s): LIPASE, AMYLASE in the last 8760 hours. No results for input(s): AMMONIA in the last 8760 hours. CBC: Recent Labs    08/10/23 1134  WBC 6.1  NEUTROABS 3,581  HGB 14.9  HCT 43.4  MCV 93.1  PLT 202   Lipid  Panel: Recent Labs    08/10/23 1134  CHOL 188  HDL 52  LDLCALC 101*  TRIG 233*  CHOLHDL 3.6   TSH: No results for input(s): TSH in the last 8760 hours. A1C: Lab Results  Component Value Date   HGBA1C 5.9 (H) 08/10/2023     Assessment/Plan  Immunization due -     Flu vaccine HIGH DOSE PF(Fluzone Trivalent)  Primary osteoarthritis involving multiple joints Assessment & Plan: Doing better after  advil  use Encouraged strength training.   Orders: -     Diclofenac  Sodium; Apply 4 g topically 4 (four) times daily.  Dispense: 150 g; Refill: 1  Prediabetes Assessment & Plan: Continue dietary modifications  Orders: -     Hemoglobin A1c  Essential hypertension, benign Assessment & Plan: Blood pressure well controlled, goal bp <140/90 Continue current medications and dietary modifications follow metabolic panel  Orders: -     Comprehensive metabolic panel with GFR -     CBC with Differential/Platelet  Mixed hyperlipidemia Assessment & Plan: Continues on lipitor and fish oil Encouraged dietary modifications Follow up lipids  Orders: -     Lipid panel  Obesity (BMI 30-39.9) Assessment & Plan: -education provided on healthy weight loss through increase in physical activity and proper nutrition      Return in about 6 months (around 08/11/2024) for routine follow up, labs at time of visit.:  Recie Cirrincione K. Caro BODILY [facility] [phone]

## 2024-02-12 NOTE — Assessment & Plan Note (Signed)
 Blood pressure well controlled, goal bp <140/90 Continue current medications and dietary modifications follow metabolic panel

## 2024-02-12 NOTE — Assessment & Plan Note (Signed)
 Doing better after advil  use Encouraged strength training.

## 2024-02-12 NOTE — Assessment & Plan Note (Signed)
-  education provided on healthy weight loss through increase in physical activity and proper nutrition

## 2024-02-13 LAB — COMPREHENSIVE METABOLIC PANEL WITH GFR
AG Ratio: 1.4 (calc) (ref 1.0–2.5)
ALT: 46 U/L (ref 9–46)
AST: 31 U/L (ref 10–35)
Albumin: 4.4 g/dL (ref 3.6–5.1)
Alkaline phosphatase (APISO): 72 U/L (ref 35–144)
BUN: 15 mg/dL (ref 7–25)
CO2: 25 mmol/L (ref 20–32)
Calcium: 9.8 mg/dL (ref 8.6–10.3)
Chloride: 101 mmol/L (ref 98–110)
Creat: 0.9 mg/dL (ref 0.70–1.28)
Globulin: 3.1 g/dL (ref 1.9–3.7)
Glucose, Bld: 120 mg/dL — ABNORMAL HIGH (ref 65–99)
Potassium: 4.4 mmol/L (ref 3.5–5.3)
Sodium: 139 mmol/L (ref 135–146)
Total Bilirubin: 0.7 mg/dL (ref 0.2–1.2)
Total Protein: 7.5 g/dL (ref 6.1–8.1)
eGFR: 91 mL/min/1.73m2 (ref >=60)

## 2024-02-13 LAB — CBC WITH DIFFERENTIAL/PLATELET
Absolute Lymphocytes: 2037 {cells}/uL (ref 850–3900)
Absolute Monocytes: 1064 {cells}/uL — ABNORMAL HIGH (ref 200–950)
Basophils Absolute: 42 {cells}/uL (ref 0–200)
Basophils Relative: 0.6 %
Eosinophils Absolute: 168 {cells}/uL (ref 15–500)
Eosinophils Relative: 2.4 %
HCT: 43.5 % (ref 38.5–50.0)
Hemoglobin: 14.7 g/dL (ref 13.2–17.1)
MCH: 32.1 pg (ref 27.0–33.0)
MCHC: 33.8 g/dL (ref 32.0–36.0)
MCV: 95 fL (ref 80.0–100.0)
MPV: 11.1 fL (ref 7.5–12.5)
Monocytes Relative: 15.2 %
Neutro Abs: 3689 {cells}/uL (ref 1500–7800)
Neutrophils Relative %: 52.7 %
Platelets: 201 Thousand/uL (ref 140–400)
RBC: 4.58 Million/uL (ref 4.20–5.80)
RDW: 12.8 % (ref 11.0–15.0)
Total Lymphocyte: 29.1 %
WBC: 7 Thousand/uL (ref 3.8–10.8)

## 2024-02-13 LAB — LIPID PANEL
Cholesterol: 164 mg/dL (ref <200)
HDL: 51 mg/dL (ref >=40)
LDL Cholesterol (Calc): 82 mg/dL
Non-HDL Cholesterol (Calc): 113 mg/dL (ref <130)
Total CHOL/HDL Ratio: 3.2 (calc) (ref <5.0)
Triglycerides: 222 mg/dL — ABNORMAL HIGH (ref <150)

## 2024-02-13 LAB — HEMOGLOBIN A1C
Hgb A1c MFr Bld: 5.8 % — ABNORMAL HIGH (ref <5.7)
Mean Plasma Glucose: 120 mg/dL
eAG (mmol/L): 6.6 mmol/L

## 2024-02-14 ENCOUNTER — Ambulatory Visit: Payer: Self-pay | Admitting: Nurse Practitioner

## 2024-03-27 ENCOUNTER — Other Ambulatory Visit: Payer: Self-pay | Admitting: Nurse Practitioner

## 2024-03-27 DIAGNOSIS — I10 Essential (primary) hypertension: Secondary | ICD-10-CM

## 2024-06-01 ENCOUNTER — Other Ambulatory Visit: Payer: Self-pay | Admitting: Nurse Practitioner

## 2024-06-01 DIAGNOSIS — J310 Chronic rhinitis: Secondary | ICD-10-CM

## 2024-06-24 ENCOUNTER — Other Ambulatory Visit: Payer: Self-pay | Admitting: Nurse Practitioner

## 2024-06-24 DIAGNOSIS — E782 Mixed hyperlipidemia: Secondary | ICD-10-CM

## 2024-08-12 ENCOUNTER — Ambulatory Visit: Payer: Self-pay | Admitting: Nurse Practitioner
